# Patient Record
Sex: Female | Born: 1975 | Race: Black or African American | Hispanic: No | Marital: Single | Smoking: Current some day smoker
Health system: Southern US, Community
[De-identification: ages and names within clinical notes are randomized; demographics above are authoritative.]

## PROBLEM LIST (undated history)

## (undated) DIAGNOSIS — R51 Headache: Secondary | ICD-10-CM

## (undated) DIAGNOSIS — N6019 Diffuse cystic mastopathy of unspecified breast: Secondary | ICD-10-CM

## (undated) DIAGNOSIS — R0989 Other specified symptoms and signs involving the circulatory and respiratory systems: Secondary | ICD-10-CM

## (undated) DIAGNOSIS — N61 Mastitis without abscess: Secondary | ICD-10-CM

## (undated) DIAGNOSIS — R059 Cough, unspecified: Secondary | ICD-10-CM

## (undated) DIAGNOSIS — N611 Abscess of the breast and nipple: Secondary | ICD-10-CM

## (undated) DIAGNOSIS — R569 Unspecified convulsions: Secondary | ICD-10-CM

## (undated) DIAGNOSIS — R519 Headache, unspecified: Secondary | ICD-10-CM

## (undated) DIAGNOSIS — R05 Cough: Secondary | ICD-10-CM

## (undated) DIAGNOSIS — I10 Essential (primary) hypertension: Secondary | ICD-10-CM

## (undated) HISTORY — DX: Headache, unspecified: R51.9

## (undated) HISTORY — DX: Headache: R51

## (undated) HISTORY — PX: BREAST EXCISIONAL BIOPSY: SUR124

## (undated) HISTORY — DX: Diffuse cystic mastopathy of unspecified breast: N60.19

---
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
## 1999-10-18 HISTORY — PX: TUBAL LIGATION: SHX77

## 2000-10-17 HISTORY — PX: WRIST FRACTURE SURGERY: SHX121

## 2001-06-28 ENCOUNTER — Emergency Department (HOSPITAL_COMMUNITY): Admission: EM | Admit: 2001-06-28 | Discharge: 2001-06-28 | Payer: Self-pay

## 2003-01-18 ENCOUNTER — Emergency Department (HOSPITAL_COMMUNITY): Admission: EM | Admit: 2003-01-18 | Discharge: 2003-01-18 | Payer: Self-pay | Admitting: Emergency Medicine

## 2004-01-18 ENCOUNTER — Emergency Department (HOSPITAL_COMMUNITY): Admission: EM | Admit: 2004-01-18 | Discharge: 2004-01-18 | Payer: Self-pay | Admitting: Emergency Medicine

## 2004-07-26 ENCOUNTER — Emergency Department (HOSPITAL_COMMUNITY): Admission: EM | Admit: 2004-07-26 | Discharge: 2004-07-26 | Payer: Self-pay | Admitting: Emergency Medicine

## 2004-07-27 ENCOUNTER — Encounter: Admission: RE | Admit: 2004-07-27 | Discharge: 2004-07-27 | Payer: Self-pay

## 2005-02-12 ENCOUNTER — Emergency Department (HOSPITAL_COMMUNITY): Admission: EM | Admit: 2005-02-12 | Discharge: 2005-02-12 | Payer: Self-pay | Admitting: Emergency Medicine

## 2007-08-01 ENCOUNTER — Emergency Department (HOSPITAL_COMMUNITY): Admission: EM | Admit: 2007-08-01 | Discharge: 2007-08-01 | Payer: Self-pay | Admitting: Emergency Medicine

## 2008-05-01 ENCOUNTER — Ambulatory Visit: Payer: Self-pay | Admitting: Internal Medicine

## 2008-05-01 ENCOUNTER — Encounter (INDEPENDENT_AMBULATORY_CARE_PROVIDER_SITE_OTHER): Payer: Self-pay | Admitting: Family Medicine

## 2008-05-01 LAB — CONVERTED CEMR LAB
ALT: 11 units/L (ref 0–35)
AST: 13 units/L (ref 0–37)
Albumin: 4.3 g/dL (ref 3.5–5.2)
Alkaline Phosphatase: 78 units/L (ref 39–117)
BUN: 13 mg/dL (ref 6–23)
Basophils Absolute: 0 10*3/uL (ref 0.0–0.1)
Basophils Relative: 1 % (ref 0–1)
CO2: 22 meq/L (ref 19–32)
Calcium: 9 mg/dL (ref 8.4–10.5)
Chloride: 105 meq/L (ref 96–112)
Creatinine, Ser: 0.93 mg/dL (ref 0.40–1.20)
Eosinophils Absolute: 0 10*3/uL (ref 0.0–0.7)
Eosinophils Relative: 1 % (ref 0–5)
Glucose, Bld: 90 mg/dL (ref 70–99)
HCT: 39.9 % (ref 36.0–46.0)
Hemoglobin: 13.4 g/dL (ref 12.0–15.0)
Lymphocytes Relative: 42 % (ref 12–46)
Lymphs Abs: 1.5 10*3/uL (ref 0.7–4.0)
MCHC: 33.6 g/dL (ref 30.0–36.0)
MCV: 84.2 fL (ref 78.0–100.0)
Monocytes Absolute: 0.2 10*3/uL (ref 0.1–1.0)
Monocytes Relative: 6 % (ref 3–12)
Neutro Abs: 1.8 10*3/uL (ref 1.7–7.7)
Neutrophils Relative %: 51 % (ref 43–77)
Platelets: 274 10*3/uL (ref 150–400)
Potassium: 4 meq/L (ref 3.5–5.3)
RBC: 4.74 M/uL (ref 3.87–5.11)
RDW: 15 % (ref 11.5–15.5)
Sed Rate: 7 mm/hr (ref 0–22)
Sodium: 138 meq/L (ref 135–145)
TSH: 0.894 microintl units/mL (ref 0.350–4.50)
Total Bilirubin: 0.3 mg/dL (ref 0.3–1.2)
Total Protein: 7.6 g/dL (ref 6.0–8.3)
WBC: 3.6 10*3/uL — ABNORMAL LOW (ref 4.0–10.5)

## 2008-05-08 ENCOUNTER — Ambulatory Visit (HOSPITAL_COMMUNITY): Admission: RE | Admit: 2008-05-08 | Discharge: 2008-05-08 | Payer: Self-pay | Admitting: Family Medicine

## 2008-07-04 ENCOUNTER — Encounter (INDEPENDENT_AMBULATORY_CARE_PROVIDER_SITE_OTHER): Payer: Self-pay | Admitting: Family Medicine

## 2008-07-04 ENCOUNTER — Ambulatory Visit: Payer: Self-pay | Admitting: Family Medicine

## 2008-07-04 LAB — CONVERTED CEMR LAB
Chlamydia, DNA Probe: POSITIVE — AB
GC Probe Amp, Genital: NEGATIVE

## 2008-07-07 ENCOUNTER — Ambulatory Visit: Payer: Self-pay | Admitting: *Deleted

## 2008-07-15 ENCOUNTER — Ambulatory Visit (HOSPITAL_COMMUNITY): Admission: RE | Admit: 2008-07-15 | Discharge: 2008-07-15 | Payer: Self-pay | Admitting: Family Medicine

## 2008-10-03 ENCOUNTER — Ambulatory Visit: Payer: Self-pay | Admitting: Obstetrics & Gynecology

## 2008-12-09 ENCOUNTER — Ambulatory Visit: Payer: Self-pay | Admitting: Family Medicine

## 2009-01-14 ENCOUNTER — Ambulatory Visit: Payer: Self-pay | Admitting: Family Medicine

## 2009-04-22 ENCOUNTER — Ambulatory Visit: Payer: Self-pay | Admitting: Internal Medicine

## 2009-04-27 ENCOUNTER — Ambulatory Visit: Payer: Self-pay | Admitting: Family Medicine

## 2009-07-31 ENCOUNTER — Encounter (INDEPENDENT_AMBULATORY_CARE_PROVIDER_SITE_OTHER): Payer: Self-pay | Admitting: Family Medicine

## 2009-07-31 ENCOUNTER — Ambulatory Visit: Payer: Self-pay | Admitting: Family Medicine

## 2009-07-31 LAB — CONVERTED CEMR LAB
Chlamydia, DNA Probe: NEGATIVE
GC Probe Amp, Genital: NEGATIVE

## 2009-08-07 ENCOUNTER — Ambulatory Visit: Payer: Self-pay | Admitting: Family Medicine

## 2009-08-07 ENCOUNTER — Telehealth (INDEPENDENT_AMBULATORY_CARE_PROVIDER_SITE_OTHER): Payer: Self-pay | Admitting: *Deleted

## 2009-12-22 ENCOUNTER — Ambulatory Visit: Payer: Self-pay | Admitting: Family Medicine

## 2009-12-22 LAB — CONVERTED CEMR LAB
ALT: 10 units/L (ref 0–35)
AST: 13 units/L (ref 0–37)
Albumin: 4.2 g/dL (ref 3.5–5.2)
Alkaline Phosphatase: 87 units/L (ref 39–117)
Anti Nuclear Antibody(ANA): NEGATIVE
BUN: 14 mg/dL (ref 6–23)
Basophils Absolute: 0 10*3/uL (ref 0.0–0.1)
Basophils Relative: 1 % (ref 0–1)
CO2: 24 meq/L (ref 19–32)
Calcium: 9.4 mg/dL (ref 8.4–10.5)
Chloride: 103 meq/L (ref 96–112)
Creatinine, Ser: 0.91 mg/dL (ref 0.40–1.20)
Eosinophils Absolute: 0.1 10*3/uL (ref 0.0–0.7)
Eosinophils Relative: 2 % (ref 0–5)
Free T4: 0.99 ng/dL (ref 0.80–1.80)
Glucose, Bld: 95 mg/dL (ref 70–99)
HCT: 39.9 % (ref 36.0–46.0)
Hemoglobin: 12.9 g/dL (ref 12.0–15.0)
Lymphocytes Relative: 41 % (ref 12–46)
Lymphs Abs: 2.1 10*3/uL (ref 0.7–4.0)
MCHC: 32.3 g/dL (ref 30.0–36.0)
MCV: 86.7 fL (ref 78.0–100.0)
Monocytes Absolute: 0.3 10*3/uL (ref 0.1–1.0)
Monocytes Relative: 6 % (ref 3–12)
Neutro Abs: 2.6 10*3/uL (ref 1.7–7.7)
Neutrophils Relative %: 51 % (ref 43–77)
Platelets: 345 10*3/uL (ref 150–400)
Potassium: 4.3 meq/L (ref 3.5–5.3)
RBC: 4.6 M/uL (ref 3.87–5.11)
RDW: 13 % (ref 11.5–15.5)
Rheumatoid fact SerPl-aCnc: <20 intl units/mL (ref 0–20)
Sodium: 138 meq/L (ref 135–145)
TSH: 1.189 microintl units/mL (ref 0.350–4.500)
Total Bilirubin: 0.2 mg/dL — ABNORMAL LOW (ref 0.3–1.2)
Total Protein: 7.3 g/dL (ref 6.0–8.3)
Vit D, 25-Hydroxy: <10 ng/mL — ABNORMAL LOW (ref 30–89)
WBC: 5.2 10*3/uL (ref 4.0–10.5)

## 2010-02-05 ENCOUNTER — Emergency Department (HOSPITAL_COMMUNITY): Admission: EM | Admit: 2010-02-05 | Discharge: 2010-02-05 | Payer: Self-pay | Admitting: Family Medicine

## 2010-02-23 ENCOUNTER — Ambulatory Visit: Payer: Self-pay | Admitting: Internal Medicine

## 2010-04-02 ENCOUNTER — Ambulatory Visit: Payer: Self-pay | Admitting: Family Medicine

## 2010-05-04 ENCOUNTER — Ambulatory Visit: Payer: Self-pay | Admitting: Family Medicine

## 2010-05-04 LAB — CONVERTED CEMR LAB: Vit D, 25-Hydroxy: 22 ng/mL — ABNORMAL LOW (ref 30–89)

## 2010-05-26 ENCOUNTER — Emergency Department (HOSPITAL_COMMUNITY): Admission: EM | Admit: 2010-05-26 | Discharge: 2010-05-26 | Payer: Self-pay | Admitting: Family Medicine

## 2010-06-17 ENCOUNTER — Ambulatory Visit: Payer: Self-pay | Admitting: Family Medicine

## 2010-06-18 ENCOUNTER — Ambulatory Visit (HOSPITAL_COMMUNITY): Admission: RE | Admit: 2010-06-18 | Discharge: 2010-06-18 | Payer: Self-pay | Admitting: Family Medicine

## 2010-06-18 ENCOUNTER — Encounter: Admission: RE | Admit: 2010-06-18 | Discharge: 2010-06-18 | Payer: Self-pay | Admitting: Family Medicine

## 2011-01-17 ENCOUNTER — Other Ambulatory Visit: Payer: Self-pay | Admitting: Family Medicine

## 2011-02-13 ENCOUNTER — Emergency Department (HOSPITAL_COMMUNITY)
Admission: EM | Admit: 2011-02-13 | Discharge: 2011-02-13 | Disposition: A | Discharge status: Home / Self Care | Payer: Self-pay | Attending: Emergency Medicine | Admitting: Emergency Medicine

## 2011-02-13 DIAGNOSIS — H6690 Otitis media, unspecified, unspecified ear: Secondary | ICD-10-CM | POA: Insufficient documentation

## 2011-02-13 DIAGNOSIS — H9209 Otalgia, unspecified ear: Secondary | ICD-10-CM | POA: Insufficient documentation

## 2011-03-01 NOTE — Group Therapy Note (Signed)
Melissa Valencia, RENFRO NO.:  [id number]   MEDICAL RECORD NO.:  [id number]          PATIENT TYPE:  WOC   LOCATION:  [facility]                   FACILITY:  [facility]   PHYSICIAN:  Johnella Moloney, MD        DATE OF BIRTH:  1976/09/02   DATE OF SERVICE:                                  CLINIC NOTE   REASON FOR VISIT:  This patient was referred by Dr. Audria Nine at  [facility] for abnormal findings on her ultrasound.   The patient is a 35 year old G4, P3-0-1-3.  She was in her usual state  of health and went to establish care at [facility] and have her  complete physical exam.  On examination, she was noted to have  tenderness to palpation in the right lower quadrant of her abdomen.  Dr.  Audria Nine then sent her for ultrasound, which showed thickening of the  endometrial stripe measuring 2.4 cm, thought to be endometrial  hyperplasia versus a polyp.  Next, probable hemorrhagic cyst within the  right ovary measuring 3.2 x 1.9 cm and Radiology recommended a followup  pelvic ultrasound in 8-12 weeks if clinically warranted.  However, the  patient is not having any symptoms.  She reports she is not really  having any abdominal pain.  The only time she had pain was upon  palpation of her abdomen.  She is not having abnormal bleeding.  She has  regular 28-day cycles and does not report excessive bleeding or bleeding  in between cycles.   PAST MEDICAL HISTORY:  Significant for pregnancy-induced hypertension.  However, this was resolved after she delivered her children.  Although,  of note, today her diastolic pressure is a little bit high at 87.   FAMILY MEDICAL HISTORY:  Significant for hypertension.   SOCIAL HISTORY:  The patient lives with 2 of her 3 children.  She smokes  half-a-pack a day for the past 12 years.  She uses occasional alcohol.  No recreational drugs.   PAST SURGERIES:  Bilateral tubal ligation.   Discussed this case with Dr. Silas Flood who states that all the  findings on  ultrasound are normal for a premenopausal moment of Ms. Lightner's age,  so no further evaluation is needed at this time.  She would need to come  back,  however, if she started to have symptoms such as excessive bleeding,  bleeding in between cycles, or pelvic pain.  She was told she could  follow up p.r.n. at the [facility].     ______________________________  Argentina Donovan, MD    ______________________________  Johnella Moloney, MD    PR/MEDQ  D:  10/03/2008  T:  10/04/2008  Job:  [id number]   cc:   Dow Adolph  Fax: [phone]

## 2011-03-04 NOTE — Consult Note (Signed)
   NAME:  Melissa Valencia, Melissa Valencia                        ACCOUNT NO.:  [id number]   MEDICAL RECORD NO.:  [id number]                   PATIENT TYPE:  EMS   LOCATION:  ED                                   FACILITY:  [facility]   PHYSICIAN:  Abigail Miyamoto, M.D.              DATE OF BIRTH:  Mar 09, 1976   DATE OF CONSULTATION:  01/18/2003  DATE OF DISCHARGE:                                   CONSULTATION   CHIEF COMPLAINT:  Perianal abscess.   HISTORY OF PRESENT ILLNESS:  This is a pleasant 35 year old female who  presents with a perianal abscess.  She reports it developed approximately  well over a week ago, has been slowly getting worse.  She has noticed no  drainage from the area.  She has had no prior history of perianal abscesses.   FAMILY HISTORY:  Negative for diabetes.   PAST MEDICAL HISTORY:  She is otherwise totally healthy.   PAST SURGICAL HISTORY:  1. Tubal ligation.  2. Operation on her right wrist.   ALLERGIES:  No known drug allergies.   MEDICATIONS:  She takes no medications.   SOCIAL HISTORY:  She does smoke cigarettes and drink alcohol occasionally.  She is employed at a nursing home.   REVIEW OF SYMPTOMS:  Otherwise unremarkable.   PHYSICAL EXAMINATION:  GENERAL:  This is a well-developed, well-nourished  female in no acute distress.  VITAL SIGNS:  She is afebrile, vital signs are  stable.  RECTAL:  Examination of the perianal area with the patient in  supine position, she has a fluctuant abscess on her left buttocks near the  gluteal cleft.   ASSESSMENT AND PLAN:  This is a patient with a perianal abscess.  At this  point, the area was prepped with Betadine, anesthetized with 1% lidocaine  with epinephrine.  A small incision was made with a #15 blade and a large  purulent cavity was identified.  The purulence was expressed from the  cavity, and the cavity was then washed with normal saline.  Dry gauze was  then placed into the cavity.  At this point, wound care  instructions were  given.  I did place her on Keflex 500 mg t.i.d. x5 days, and gave her a  prescription for Vicodin for pain.  She will follow up with our office on a  p.r.n. basis.                                                 Abigail Miyamoto, M.D.   DB/MEDQ  D:  01/18/2003  T:  01/19/2003  Job:  [id number]

## 2011-04-25 ENCOUNTER — Ambulatory Visit (INDEPENDENT_AMBULATORY_CARE_PROVIDER_SITE_OTHER): Payer: Self-pay | Admitting: Surgery

## 2011-04-25 ENCOUNTER — Ambulatory Visit (INDEPENDENT_AMBULATORY_CARE_PROVIDER_SITE_OTHER): Payer: Self-pay

## 2011-05-05 ENCOUNTER — Ambulatory Visit (INDEPENDENT_AMBULATORY_CARE_PROVIDER_SITE_OTHER): Payer: Self-pay | Admitting: General Surgery

## 2011-05-06 ENCOUNTER — Encounter (INDEPENDENT_AMBULATORY_CARE_PROVIDER_SITE_OTHER): Payer: Self-pay | Admitting: General Surgery

## 2011-05-09 ENCOUNTER — Encounter (INDEPENDENT_AMBULATORY_CARE_PROVIDER_SITE_OTHER): Payer: Self-pay | Admitting: General Surgery

## 2011-05-09 ENCOUNTER — Ambulatory Visit (INDEPENDENT_AMBULATORY_CARE_PROVIDER_SITE_OTHER): Payer: PRIVATE HEALTH INSURANCE | Admitting: General Surgery

## 2011-05-09 VITALS — BP 142/98 | HR 64 | Temp 96.6°F | Ht 66.0 in | Wt 226.2 lb

## 2011-05-09 DIAGNOSIS — N6019 Diffuse cystic mastopathy of unspecified breast: Secondary | ICD-10-CM

## 2011-05-09 MED ORDER — PREDNISONE (PAK) 10 MG PO TABS: ORAL_TABLET | ORAL | Status: DC

## 2011-05-09 NOTE — Progress Notes (Signed)
Melissa Valencia is a 35 y.o. female.    Chief Complaint  Patient presents with  . Other    est pt- lump in rt br    HPI HPI The patient has been having a lump near her R nipple over a year.  It swells during her period and usually turns red and opens and drains.  She has had a mammogram in September of 2011 which was negative for concern for malignancy.  Her nipples are asymmetric and the right one is painful.  She has not been on antibiotics for the last 3 months.  She denies fevers/chills.   Past Medical History  Diagnosis Date  . Breast lump   . Breast lump     rt  . Generalized headaches     Past Surgical History  Procedure Date  . Induced abortion 1996  . Tubal ligation 2001  . Broke wrist 2002    Family History  Problem Relation Age of Onset  . Hypertension Mother   . Hypertension Father   . Hypertension Sister     Social History History  Substance Use Topics  . Smoking status: Former Games developer  . Smokeless tobacco: Not on file  . Alcohol Use: 0.6 oz/week    1 Glasses of wine per week    No Known Allergies  Current Outpatient Prescriptions  Medication Sig Dispense Refill  . predniSONE (DELTASONE) 10 MG tablet pack Take 6 tabs po qday times 2 days, then 5 tabs po qday times 2 days, then 4 tabs po qday times 2 days, then 3 tabs po qday times 2 days, then 2 tabs po qday times 2 days, then 1 tab po times 4 days, then stop.  44 tablet  0    Review of Systems Review of Systems  Constitutional: Negative.   HENT: Negative.   Eyes: Negative.   Respiratory: Negative.   Cardiovascular: Negative.   Gastrointestinal: Negative.   Genitourinary: Negative.   Musculoskeletal: Negative.   Skin: Negative.   Neurological: Negative.   Endo/Heme/Allergies: Negative.   Psychiatric/Behavioral: Negative.     Physical Exam Physical Exam  Constitutional: She is oriented to person, place, and time. She appears well-developed and well-nourished.  HENT:  Head:  Normocephalic and atraumatic.  Mouth/Throat: No oropharyngeal exudate.  Eyes: Conjunctivae are normal. Pupils are equal, round, and reactive to light. No scleral icterus.  Neck: Normal range of motion. Neck supple. No tracheal deviation present. No thyromegaly present.  Cardiovascular: Normal rate and regular rhythm.   Respiratory: Effort normal and breath sounds normal. No respiratory distress. She exhibits no tenderness. Right breast exhibits inverted nipple, mass, skin change and tenderness. Right breast exhibits no nipple discharge. Left breast exhibits no inverted nipple, no mass, no nipple discharge, no skin change and no tenderness. Breasts are asymmetrical.         The R nipple has a nodule at 2-3 oclock that is 1 cm. It is tender.  There is a scar from a prior I&D  GI: Soft. She exhibits no distension and no mass. There is no tenderness.  Musculoskeletal: Normal range of motion.  Lymphadenopathy:    She has no cervical adenopathy.  Neurological: She is alert and oriented to person, place, and time.  Psychiatric: She has a normal mood and affect. Her behavior is normal. Judgment and thought content normal.     Blood pressure 142/98, pulse 64, temperature 96.6 F (35.9 C), height 5\' 6"  (1.676 m), weight 226 lb 3.2 oz (102.604 kg).  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 If no improvement, resect chronic infectious tract.  Jordie Schreur 05/09/2011, 3:23 PM

## 2011-05-10 ENCOUNTER — Emergency Department (HOSPITAL_COMMUNITY)
Admission: EM | Admit: 2011-05-10 | Discharge: 2011-05-10 | Disposition: A | Discharge status: Home / Self Care | Payer: Self-pay | Attending: Emergency Medicine | Admitting: Emergency Medicine

## 2011-05-10 ENCOUNTER — Inpatient Hospital Stay (INDEPENDENT_AMBULATORY_CARE_PROVIDER_SITE_OTHER)
Admission: RE | Admit: 2011-05-10 | Discharge: 2011-05-10 | Disposition: A | Discharge status: Home / Self Care | Payer: Self-pay | Source: Ambulatory Visit | Attending: Emergency Medicine | Admitting: Emergency Medicine

## 2011-05-10 ENCOUNTER — Emergency Department (HOSPITAL_COMMUNITY): Payer: Self-pay

## 2011-05-10 DIAGNOSIS — R110 Nausea: Secondary | ICD-10-CM | POA: Insufficient documentation

## 2011-05-10 DIAGNOSIS — R42 Dizziness and giddiness: Secondary | ICD-10-CM | POA: Insufficient documentation

## 2011-05-10 DIAGNOSIS — H53149 Visual discomfort, unspecified: Secondary | ICD-10-CM | POA: Insufficient documentation

## 2011-05-10 DIAGNOSIS — R51 Headache: Secondary | ICD-10-CM

## 2011-06-16 ENCOUNTER — Ambulatory Visit (INDEPENDENT_AMBULATORY_CARE_PROVIDER_SITE_OTHER): Payer: PRIVATE HEALTH INSURANCE | Admitting: General Surgery

## 2011-06-16 ENCOUNTER — Encounter (INDEPENDENT_AMBULATORY_CARE_PROVIDER_SITE_OTHER): Payer: Self-pay

## 2011-06-16 ENCOUNTER — Encounter (INDEPENDENT_AMBULATORY_CARE_PROVIDER_SITE_OTHER): Payer: Self-pay | Admitting: General Surgery

## 2011-06-16 DIAGNOSIS — N6019 Diffuse cystic mastopathy of unspecified breast: Secondary | ICD-10-CM

## 2011-06-16 MED ORDER — PREDNISONE (PAK) 10 MG PO TABS: ORAL_TABLET | ORAL | Status: DC

## 2011-06-16 NOTE — Progress Notes (Signed)
HISTORY: Pt states that the steroid course made the knot and the drainage go completely away until her period started.  She had been off the steroids for around a week when her period started.  At that point, she developed a knot on the medial R nipple that opened and drained with neosporin and a band-aid on it.  It has improved since it drained, and is still better than it has been over the last two years.  She denies fever/chills.     PERTINENT REVIEW OF SYSTEMS: Otherwise negative other than the HPI.     EXAM: Head: Normocephalic and atraumatic.  Eyes:  Conjunctivae are normal. Pupils are equal, round, and reactive to light. No scleral icterus.  Neck:  Normal range of motion. Neck supple. No tracheal deviation present. No thyromegaly present.  Resp: No respiratory distress, normal effort. Neurological: Alert and oriented to person, place, and time. Coordination normal.  Skin: Skin is warm and dry. No rash noted. No diaphoretic. No erythema. No pallor.  Psychiatric: Normal mood and affect. Normal behavior. Judgment and thought content normal.  Breast exam:  R breast with decreased nipple retraction.  No mass palpable and hard knot at medial nipple not present.  There is a small spot where it is apparent drainage has been coming from, but this is absent at this time.     ASSESSMENT AND PLAN:   Mastitis chronic Significant improvement w prednisone. Brief recurrence around menstrual cycle.  Try another course of steroids through menstrual cycle. Follow up in 8 weeks.         Maudry Diego, MD Surgical Oncology, General & Endocrine Surgery [facility]  Dow Adolph, MD Dow Adolph, MD

## 2011-06-16 NOTE — Assessment & Plan Note (Signed)
Significant improvement w prednisone. Brief recurrence around menstrual cycle.  Try another course of steroids through menstrual cycle. Follow up in 8 weeks.

## 2011-06-17 ENCOUNTER — Encounter (INDEPENDENT_AMBULATORY_CARE_PROVIDER_SITE_OTHER): Payer: PRIVATE HEALTH INSURANCE | Admitting: General Surgery

## 2011-07-27 LAB — URINE MICROSCOPIC-ADD ON

## 2011-07-27 LAB — URINALYSIS, ROUTINE W REFLEX MICROSCOPIC
Bilirubin Urine: NEGATIVE
Glucose, UA: NEGATIVE
Ketones, ur: NEGATIVE
Nitrite: NEGATIVE
Protein, ur: 100 — AB
Specific Gravity, Urine: 1.024
Urobilinogen, UA: 1
pH: 6.5

## 2011-07-27 LAB — PREGNANCY, URINE: Preg Test, Ur: NEGATIVE

## 2011-08-01 ENCOUNTER — Encounter (INDEPENDENT_AMBULATORY_CARE_PROVIDER_SITE_OTHER): Payer: PRIVATE HEALTH INSURANCE | Admitting: General Surgery

## 2011-08-05 ENCOUNTER — Encounter (INDEPENDENT_AMBULATORY_CARE_PROVIDER_SITE_OTHER): Payer: Self-pay

## 2011-08-05 ENCOUNTER — Telehealth (INDEPENDENT_AMBULATORY_CARE_PROVIDER_SITE_OTHER): Payer: Self-pay

## 2011-08-05 ENCOUNTER — Other Ambulatory Visit (INDEPENDENT_AMBULATORY_CARE_PROVIDER_SITE_OTHER): Payer: Self-pay | Admitting: General Surgery

## 2011-08-05 DIAGNOSIS — N6019 Diffuse cystic mastopathy of unspecified breast: Secondary | ICD-10-CM

## 2011-08-05 MED ORDER — PREDNISONE (PAK) 10 MG PO TABS: ORAL_TABLET | ORAL | Status: DC

## 2011-08-05 NOTE — Telephone Encounter (Signed)
Called in refill for Prednisone 10mg  tablets #68 w/ no refills to [facility].  Pt is aware.

## 2011-08-16 ENCOUNTER — Encounter (INDEPENDENT_AMBULATORY_CARE_PROVIDER_SITE_OTHER): Payer: Self-pay | Admitting: General Surgery

## 2011-08-16 ENCOUNTER — Ambulatory Visit (INDEPENDENT_AMBULATORY_CARE_PROVIDER_SITE_OTHER): Payer: PRIVATE HEALTH INSURANCE | Admitting: General Surgery

## 2011-08-16 VITALS — BP 132/90 | HR 64 | Temp 97.6°F | Resp 14 | Ht 67.0 in | Wt 228.8 lb

## 2011-08-16 DIAGNOSIS — N6019 Diffuse cystic mastopathy of unspecified breast: Secondary | ICD-10-CM

## 2011-08-16 MED ORDER — DOXYCYCLINE HYCLATE 100 MG PO TABS: 100.0000 mg | ORAL_TABLET | Freq: Two times a day (BID) | ORAL | Status: AC

## 2011-08-16 NOTE — Assessment & Plan Note (Signed)
Had development of small abscess and spontaneous rupture. Give doxycycline 100 mg po BID times 3 weeks.   Follow up in 4 weeks.   May at some point need I&D or resection

## 2011-08-16 NOTE — Progress Notes (Signed)
HISTORY: Patient is a 35 year old female with a history of intermittent infections in her right breast. She recently developed pain. She has a trial of prednisone and this helped the pain initially it better. However she then developed swelling and spontaneous rupture of this area. She denies any fevers or chills.  She continues to have some purulent drainage from an area on the medial aspect of the nipple.   PERTINENT REVIEW OF SYSTEMS: Otherwise negative.     EXAM: Head: Normocephalic and atraumatic.  Eyes:  Conjunctivae are normal. Pupils are equal, round, and reactive to light. No scleral icterus.  Neck:  Normal range of motion. Neck supple. No tracheal deviation present. No thyromegaly present.  Resp: No respiratory distress, normal effort. Abd:  Abdomen is soft, non distended and non tender. No masses are palpable.  There is no rebound and no guarding.  Neurological: Alert and oriented to person, place, and time. Coordination normal.  Skin: Skin is warm and dry. No rash noted. No diaphoretic. No erythema. No pallor.  Psychiatric: Normal mood and affect. Normal behavior. Judgment and thought content normal.    ASSESSMENT AND PLAN:   Chronic mastitis Had development of small abscess and spontaneous rupture. Give doxycycline 100 mg po BID times 3 weeks.   Follow up in 4 weeks.   May at some point need I&D or resection        Maudry Diego, MD Surgical Oncology, General & Endocrine Surgery [facility]  Dow Adolph, MD Dow Adolph, MD

## 2011-08-16 NOTE — Patient Instructions (Signed)
Take antibiotic. Follow up with me in 4 weeks.

## 2011-09-05 ENCOUNTER — Encounter (INDEPENDENT_AMBULATORY_CARE_PROVIDER_SITE_OTHER): Payer: Self-pay | Admitting: General Surgery

## 2011-09-16 ENCOUNTER — Ambulatory Visit (INDEPENDENT_AMBULATORY_CARE_PROVIDER_SITE_OTHER): Payer: PRIVATE HEALTH INSURANCE | Admitting: General Surgery

## 2011-09-16 ENCOUNTER — Other Ambulatory Visit (INDEPENDENT_AMBULATORY_CARE_PROVIDER_SITE_OTHER): Payer: Self-pay | Admitting: General Surgery

## 2011-09-16 ENCOUNTER — Encounter (INDEPENDENT_AMBULATORY_CARE_PROVIDER_SITE_OTHER): Payer: Self-pay

## 2011-09-16 ENCOUNTER — Encounter (INDEPENDENT_AMBULATORY_CARE_PROVIDER_SITE_OTHER): Payer: Self-pay | Admitting: General Surgery

## 2011-09-16 VITALS — BP 114/88 | HR 80 | Temp 97.4°F | Resp 16 | Ht 67.0 in | Wt 230.8 lb

## 2011-09-16 DIAGNOSIS — N611 Abscess of the breast and nipple: Secondary | ICD-10-CM | POA: Insufficient documentation

## 2011-09-16 DIAGNOSIS — N61 Mastitis without abscess: Secondary | ICD-10-CM

## 2011-09-16 MED ORDER — HYDROCODONE-ACETAMINOPHEN 5-500 MG PO TABS: ORAL_TABLET | ORAL | Status: DC

## 2011-09-16 NOTE — Progress Notes (Signed)
Addended by: Maryan Puls on: 09/16/2011 01:11 PM   Modules accepted: Orders

## 2011-09-16 NOTE — Progress Notes (Signed)
Addended by: Milas Hock on: 09/16/2011 10:42 AM   Modules accepted: Orders

## 2011-09-16 NOTE — Patient Instructions (Signed)
IF YOU ARE TAKING ASPIRIN, COUMADIN/WARFARIN, PLAVIX, OR OTHER BLOOD THINNER, PLEASE LET us KNOW IMMEDIATELY.  WE WILL NEED TO DISCUSS WITH THE PRESCRIBING PROVIDER IF THESE ARE SAFE TO STOP. IF THESE ARE NOT STOPPED AT THE APPROPRIATE TIME, THIS WILL RESULT IN A DELAY FOR YOUR SURGERY.  DO NOT TAKE THESE MEDICATIONS OR IBUPROFEN/NAPROXEN WITHIN A WEEK BEFORE SURGERY.   The main risks of surgery are bleeding, infection, damage to other structures, and seroma (accumulation of fluid) under the incision site(s).  You are at high risk for recurrent infections.    These complications may lead to additional procedures such as drainage of seroma/infection.   Most women do accumulate fluid in the breast cavity where the specimen was removed. We do not always have to drain this fluid.  If your breast is very tense, painful, or red, then we may need to numb the skin and use a needle to aspirate the fluid.  We do provide patients with a Breast Binder.  The purpose of this is to avoid the use of tape on the sensitive tissue of the breast and to provide some compression to minimize the risk of seroma.  If the binder is uncomfortable, you may find that a tank top with a built-in shelf bra or a loose sports bra works better for you.  I recommend wearing this around the clock for the first 1-2 weeks except in the shower.    You may remove your dressings and may shower 48 hours after surgery IF YOU DO NOT HAVE A DRAIN.    Many patients have some constipation in the week after surgery due to the narcotics and anesthesia.  You may need over the counter stool softeners or laxatives if you experience difficulty having bowel movements.    If the following occur, call our office at [phone]: If you have a fever over 101 or pain that is severe despite narcotics. If you have redness or drainage at the wound. If you develop persistent nausea or vomiting.  I will follow you back up in 1-4 weeks.    Please submit  any paperwork about time off work/insurance forms to the front desk.

## 2011-09-16 NOTE — Progress Notes (Signed)
Chief Complaint  Patient presents with  . Follow-up    reck rt br inflammation    HISTORY: Patient is a 35-year-old female who I have been seeing for several months for her chronic right breast abscess. She has an abscess that comes up every Monday when she is on her period. It usually spontaneously drains, but by the time she sees me in her breast is soft and nontender.  She presents today with a firm painful right breast. She has not had any fevers or chills. She has had redness of the breast.  Past Medical History  Diagnosis Date  . Breast lump   . Breast lump     rt  . Generalized headaches   . Breast inflammation     Past Surgical History  Procedure Date  . Induced abortion 1996  . Tubal ligation 2001  . Broke wrist 2002    Current Outpatient Prescriptions  Medication Sig Dispense Refill  . lisinopril (PRINIVIL,ZESTRIL) 10 MG tablet Take 10 mg by mouth daily.           No Known Allergies   Family History  Problem Relation Age of Onset  . Hypertension Mother   . Hypertension Father   . Hypertension Sister      History   Social History  . Marital Status: Single    Spouse Name: N/A    Number of Children: N/A  . Years of Education: N/A   Social History Main Topics  . Smoking status: Former Smoker  . Smokeless tobacco: Never Used  . Alcohol Use: 0.6 oz/week    1 Glasses of wine per week  . Drug Use: No  . Sexually Active: None    REVIEW OF SYSTEMS - PERTINENT POSITIVES ONLY: 12 point review of systems negative other than HPI and PMH.    EXAM: Filed Vitals:   09/16/11 0938  BP: 114/88  Pulse: 80  Temp: 97.4 F (36.3 C)  Resp: 16    Gen:  No acute distress.  Well nourished and well groomed.   Neurological: Alert and oriented to person, place, and time. Coordination normal.  Head: Normocephalic and atraumatic.  Eyes: Conjunctivae are normal. Pupils are equal, round, and reactive to light. No scleral icterus.  Cardiovascular: Normal rate, intact  distal pulses.   Respiratory: Effort normal.  No respiratory distress.  Breast:  R breast has fluctuant area at 3 oclock just at the border of the nipple.  This is anesthetized, and incised with a #11 blade.  Copious amounts of pus are expressed.  This is cultured.   GI: Soft. Bowel sounds are normal. The abdomen is soft and nontender.  There is no rebound and no guarding.  Musculoskeletal: Normal range of motion. Extremities are nontender.  Lymphadenopathy: No cervical, preauricular, postauricular or axillary adenopathy is present Skin: Skin is warm and dry. No rash noted. No diaphoresis. No erythema. No pallor. No clubbing, cyanosis, or edema.   Psychiatric: Normal mood and affect. Behavior is normal. Judgment and thought content normal.    ASSESSMENT AND PLAN: Abscess of right breast I&D performed in standard fashion after administration of local anesthetic.   Will plan excision in OR of chronic area of infection.    The surgical procedure was described to the patient.  I discussed the incision type and location and whether we would need radiology involved on the day of surgery with a wire marker and/or sentinel node.      The risks and benefits of the procedure were   described to the patient and he/she wishes to proceed.    We discussed the risks bleeding, infection, damage to other structures, need for further procedures/surgeries.  We discussed the risk of seroma.  The patient was advised if the area in the breast in cancer, we may need to go back to surgery for additional tissue to obtain negative margins or for a lymph node biopsy. The patient was advised that these are the most common complications, but that others can occur as well.  They were advised against taking aspirin or other anti-inflammatory agents/blood thinners the week before surgery.         Shataya Winkles L Khelani Kops MD Surgical Oncology, General and Endocrine Surgery [facility]      Visit  Diagnoses: 1. Abscess of right breast     Primary Care Physician: MCPHERSON,BARBARA, MD    

## 2011-09-16 NOTE — Assessment & Plan Note (Addendum)
I&D performed in standard fashion after administration of local anesthetic.   Will plan excision in OR of chronic area of infection.    The surgical procedure was described to the patient.  I discussed the incision type and location and whether we would need radiology involved on the day of surgery with a wire marker and/or sentinel node.      The risks and benefits of the procedure were described to the patient and he/she wishes to proceed.    We discussed the risks bleeding, infection, damage to other structures, need for further procedures/surgeries.  We discussed the risk of seroma.  The patient was advised if the area in the breast in cancer, we may need to go back to surgery for additional tissue to obtain negative margins or for a lymph node biopsy. The patient was advised that these are the most common complications, but that others can occur as well.  They were advised against taking aspirin or other anti-inflammatory agents/blood thinners the week before surgery.

## 2011-09-19 LAB — WOUND CULTURE
Gram Stain: NONE SEEN
Organism ID, Bacteria: NO GROWTH

## 2011-09-26 ENCOUNTER — Encounter (INDEPENDENT_AMBULATORY_CARE_PROVIDER_SITE_OTHER): Payer: Self-pay | Admitting: General Surgery

## 2011-09-29 ENCOUNTER — Encounter (HOSPITAL_BASED_OUTPATIENT_CLINIC_OR_DEPARTMENT_OTHER): Payer: Self-pay | Admitting: *Deleted

## 2011-09-29 NOTE — Pre-Procedure Instructions (Signed)
To come for BMET, EKG

## 2011-10-03 ENCOUNTER — Encounter (HOSPITAL_BASED_OUTPATIENT_CLINIC_OR_DEPARTMENT_OTHER)
Admission: RE | Admit: 2011-10-03 | Discharge: 2011-10-03 | Disposition: A | Discharge status: Home / Self Care | Payer: Self-pay | Source: Ambulatory Visit | Attending: General Surgery | Admitting: General Surgery

## 2011-10-03 LAB — BASIC METABOLIC PANEL
BUN: 14 mg/dL (ref 6–23)
CO2: 26 mEq/L (ref 19–32)
Calcium: 9.1 mg/dL (ref 8.4–10.5)
Creatinine, Ser: 1.02 mg/dL (ref 0.50–1.10)
Glucose, Bld: 91 mg/dL (ref 70–99)

## 2011-10-05 ENCOUNTER — Encounter (HOSPITAL_BASED_OUTPATIENT_CLINIC_OR_DEPARTMENT_OTHER)
Admission: RE | Disposition: A | Discharge status: Home / Self Care | Payer: Self-pay | Source: Ambulatory Visit | Attending: General Surgery

## 2011-10-05 ENCOUNTER — Ambulatory Visit (HOSPITAL_BASED_OUTPATIENT_CLINIC_OR_DEPARTMENT_OTHER)
Admission: RE | Admit: 2011-10-05 | Discharge: 2011-10-05 | Disposition: A | Discharge status: Home / Self Care | Payer: Self-pay | Source: Ambulatory Visit | Attending: General Surgery | Admitting: General Surgery

## 2011-10-05 ENCOUNTER — Encounter (HOSPITAL_BASED_OUTPATIENT_CLINIC_OR_DEPARTMENT_OTHER): Payer: Self-pay

## 2011-10-05 ENCOUNTER — Encounter (HOSPITAL_BASED_OUTPATIENT_CLINIC_OR_DEPARTMENT_OTHER): Payer: Self-pay | Admitting: Anesthesiology

## 2011-10-05 ENCOUNTER — Encounter (HOSPITAL_BASED_OUTPATIENT_CLINIC_OR_DEPARTMENT_OTHER): Payer: Self-pay | Admitting: General Surgery

## 2011-10-05 ENCOUNTER — Ambulatory Visit (HOSPITAL_BASED_OUTPATIENT_CLINIC_OR_DEPARTMENT_OTHER): Payer: Self-pay | Admitting: Anesthesiology

## 2011-10-05 DIAGNOSIS — N61 Mastitis without abscess: Secondary | ICD-10-CM

## 2011-10-05 HISTORY — DX: Abscess of the breast and nipple: N61.1

## 2011-10-05 HISTORY — DX: Unspecified convulsions: R56.9

## 2011-10-05 HISTORY — DX: Cough: R05

## 2011-10-05 HISTORY — DX: Essential (primary) hypertension: I10

## 2011-10-05 HISTORY — PX: BREAST CYST EXCISION: SHX579

## 2011-10-05 HISTORY — DX: Other specified symptoms and signs involving the circulatory and respiratory systems: R09.89

## 2011-10-05 HISTORY — DX: Cough, unspecified: R05.9

## 2011-10-05 HISTORY — DX: Mastitis without abscess: N61.0

## 2011-10-05 SURGERY — EXCISION, CYST, BREAST
Anesthesia: General | Site: Breast | Laterality: Right | Wound class: Clean

## 2011-10-05 MED ORDER — DEXAMETHASONE SODIUM PHOSPHATE 4 MG/ML IJ SOLN
INTRAMUSCULAR | Status: DC | PRN
  Administered 2011-10-05: 10 mg via INTRAVENOUS

## 2011-10-05 MED ORDER — PROPOFOL 10 MG/ML IV EMUL
INTRAVENOUS | Status: DC | PRN
  Administered 2011-10-05: 250 mg via INTRAVENOUS

## 2011-10-05 MED ORDER — LIDOCAINE-EPINEPHRINE (PF) 1 %-1:200000 IJ SOLN
INTRAMUSCULAR | Status: DC | PRN
  Administered 2011-10-05: 10 mL

## 2011-10-05 MED ORDER — FENTANYL CITRATE 0.05 MG/ML IJ SOLN: 50.0000 ug | INTRAMUSCULAR | Status: DC | PRN

## 2011-10-05 MED ORDER — MIDAZOLAM HCL 2 MG/2ML IJ SOLN: 0.5000 mg | INTRAMUSCULAR | Status: DC | PRN

## 2011-10-05 MED ORDER — ONDANSETRON HCL 4 MG/2ML IJ SOLN
INTRAMUSCULAR | Status: DC | PRN
  Administered 2011-10-05: 4 mg via INTRAVENOUS

## 2011-10-05 MED ORDER — CEFAZOLIN SODIUM-DEXTROSE 2-3 GM-% IV SOLR
2.0000 g | INTRAVENOUS | Status: AC
  Administered 2011-10-05: 2 g via INTRAVENOUS

## 2011-10-05 MED ORDER — METOCLOPRAMIDE HCL 5 MG/ML IJ SOLN
10.0000 mg | Freq: Once | INTRAMUSCULAR | Status: AC | PRN
  Administered 2011-10-05 (×2): 10 mg via INTRAVENOUS

## 2011-10-05 MED ORDER — BUPIVACAINE HCL (PF) 0.25 % IJ SOLN
INTRAMUSCULAR | Status: DC | PRN
  Administered 2011-10-05: 10 mL

## 2011-10-05 MED ORDER — OXYCODONE-ACETAMINOPHEN 5-325 MG PO TABS: 1.0000 | ORAL_TABLET | ORAL | Status: DC | PRN

## 2011-10-05 MED ORDER — LIDOCAINE HCL (CARDIAC) 20 MG/ML IV SOLN
INTRAVENOUS | Status: DC | PRN
  Administered 2011-10-05: 75 mg via INTRAVENOUS

## 2011-10-05 MED ORDER — DROPERIDOL 2.5 MG/ML IJ SOLN
INTRAMUSCULAR | Status: DC | PRN
  Administered 2011-10-05: 0.625 mg via INTRAVENOUS

## 2011-10-05 MED ORDER — LACTATED RINGERS IV SOLN: INTRAVENOUS | Status: DC

## 2011-10-05 MED ORDER — MIDAZOLAM HCL 5 MG/5ML IJ SOLN
INTRAMUSCULAR | Status: DC | PRN
  Administered 2011-10-05: 2 mg via INTRAVENOUS

## 2011-10-05 MED ORDER — MORPHINE SULFATE 2 MG/ML IJ SOLN: 0.0500 mg/kg | INTRAMUSCULAR | Status: DC | PRN

## 2011-10-05 MED ORDER — FENTANYL CITRATE 0.05 MG/ML IJ SOLN
INTRAMUSCULAR | Status: DC | PRN
  Administered 2011-10-05: 100 ug via INTRAVENOUS
  Administered 2011-10-05 (×2): 50 ug via INTRAVENOUS

## 2011-10-05 MED ORDER — FENTANYL CITRATE 0.05 MG/ML IJ SOLN: 25.0000 ug | INTRAMUSCULAR | Status: DC | PRN

## 2011-10-05 MED ORDER — LACTATED RINGERS IV SOLN
INTRAVENOUS | Status: DC | PRN
  Administered 2011-10-05 (×2): via INTRAVENOUS

## 2011-10-05 SURGICAL SUPPLY — 44 items
BINDER BREAST XLRG (GAUZE/BANDAGES/DRESSINGS) ×2 IMPLANT
BLADE HEX COATED 2.75 (ELECTRODE) ×2 IMPLANT
BLADE SURG 15 STRL LF DISP TIS (BLADE) ×1 IMPLANT
BLADE SURG 15 STRL SS (BLADE) ×1
CANISTER SUCTION 1200CC (MISCELLANEOUS) ×2 IMPLANT
CHLORAPREP W/TINT 26ML (MISCELLANEOUS) ×2 IMPLANT
CLIP TI WIDE RED SMALL 6 (CLIP) IMPLANT
CLOTH BEACON ORANGE TIMEOUT ST (SAFETY) ×2 IMPLANT
COVER MAYO STAND STRL (DRAPES) ×2 IMPLANT
COVER TABLE BACK 60X90 (DRAPES) ×2 IMPLANT
DECANTER SPIKE VIAL GLASS SM (MISCELLANEOUS) ×2 IMPLANT
DEVICE DUBIN W/COMP PLATE 8390 (MISCELLANEOUS) IMPLANT
DRAPE PED LAPAROTOMY (DRAPES) ×2 IMPLANT
DRAPE UTILITY XL STRL (DRAPES) ×2 IMPLANT
ELECT REM PT RETURN 9FT ADLT (ELECTROSURGICAL) ×2
ELECTRODE REM PT RTRN 9FT ADLT (ELECTROSURGICAL) ×1 IMPLANT
GLOVE BIO SURGEON STRL SZ 6 (GLOVE) ×2 IMPLANT
GLOVE BIOGEL PI IND STRL 6.5 (GLOVE) ×1 IMPLANT
GLOVE BIOGEL PI INDICATOR 6.5 (GLOVE) ×1
GOWN PREVENTION PLUS XLARGE (GOWN DISPOSABLE) ×2 IMPLANT
GOWN PREVENTION PLUS XXLARGE (GOWN DISPOSABLE) ×2 IMPLANT
KIT MARKER MARGIN INK (KITS) IMPLANT
NEEDLE HYPO 25X1 1.5 SAFETY (NEEDLE) ×2 IMPLANT
NS IRRIG 1000ML POUR BTL (IV SOLUTION) ×2 IMPLANT
PACK BASIN DAY SURGERY FS (CUSTOM PROCEDURE TRAY) ×2 IMPLANT
PENCIL BUTTON HOLSTER BLD 10FT (ELECTRODE) ×2 IMPLANT
SLEEVE SCD COMPRESS KNEE MED (MISCELLANEOUS) ×2 IMPLANT
SPONGE GAUZE 4X4 16PLY UNSTER (WOUND CARE) ×4 IMPLANT
SPONGE LAP 18X18 X RAY DECT (DISPOSABLE) ×2 IMPLANT
STAPLER VISISTAT 35W (STAPLE) IMPLANT
STRIP CLOSURE SKIN 1/2X4 (GAUZE/BANDAGES/DRESSINGS) ×2 IMPLANT
SUT MON AB 4-0 PC3 18 (SUTURE) ×2 IMPLANT
SUT SILK 2 0 SH (SUTURE) IMPLANT
SUT VIC AB 3-0 54X BRD REEL (SUTURE) IMPLANT
SUT VIC AB 3-0 BRD 54 (SUTURE)
SUT VIC AB 3-0 SH 27 (SUTURE) ×1
SUT VIC AB 3-0 SH 27X BRD (SUTURE) ×1 IMPLANT
SYR BULB 3OZ (MISCELLANEOUS) ×2 IMPLANT
SYR CONTROL 10ML LL (SYRINGE) ×2 IMPLANT
TOWEL OR 17X24 6PK STRL BLUE (TOWEL DISPOSABLE) ×2 IMPLANT
TOWEL OR NON WOVEN STRL DISP B (DISPOSABLE) ×2 IMPLANT
TUBE CONNECTING 20X1/4 (TUBING) ×2 IMPLANT
WATER STERILE IRR 1000ML POUR (IV SOLUTION) IMPLANT
YANKAUER SUCT BULB TIP NO VENT (SUCTIONS) ×2 IMPLANT

## 2011-10-05 NOTE — Transfer of Care (Signed)
Immediate Anesthesia Transfer of Care Note  Patient: Melissa Valencia  Procedure(s) Performed:  CYST EXCISION BREAST - Chronic right breast abscess. Excision of chronic right breast infection  Patient Location: PACU  Anesthesia Type: General  Level of Consciousness: awake, alert  and oriented  Airway & Oxygen Therapy: Patient Spontanous Breathing  Post-op Assessment: Report given to PACU RN and Post -op Vital signs reviewed and stable  Post vital signs: Reviewed and stable  Complications: No apparent anesthesia complications

## 2011-10-05 NOTE — Anesthesia Preprocedure Evaluation (Signed)
Anesthesia Evaluation  Patient identified by MRN, date of birth, ID band Patient awake    Reviewed: Allergy & Precautions, H&P , NPO status , Patient's Chart, lab work & pertinent test results, reviewed documented beta blocker date and time   Airway Mallampati: II TM Distance: >3 FB Neck ROM: full    Dental   Pulmonary neg pulmonary ROS,          Cardiovascular hypertension,     Neuro/Psych Negative Neurological ROS  Negative Psych ROS   GI/Hepatic negative GI ROS, Neg liver ROS,   Endo/Other  Negative Endocrine ROS  Renal/GU negative Renal ROS  Genitourinary negative   Musculoskeletal   Abdominal   Peds  Hematology negative hematology ROS (+)   Anesthesia Other Findings See surgeon's H&P   Reproductive/Obstetrics negative OB ROS                           Anesthesia Physical Anesthesia Plan  ASA: II  Anesthesia Plan: General   Post-op Pain Management:    Induction: Intravenous  Airway Management Planned: LMA  Additional Equipment:   Intra-op Plan:   Post-operative Plan: Extubation in OR  Informed Consent: I have reviewed the patients History and Physical, chart, labs and discussed the procedure including the risks, benefits and alternatives for the proposed anesthesia with the patient or authorized representative who has indicated his/her understanding and acceptance.     Plan Discussed with: CRNA and Surgeon  Anesthesia Plan Comments:         Anesthesia Quick Evaluation  

## 2011-10-05 NOTE — Anesthesia Procedure Notes (Signed)
Procedure Name: LMA Insertion Date/Time: 10/05/2011 8:50 AM Performed by: Zenia Resides D Pre-anesthesia Checklist: Patient identified, Emergency Drugs available, Suction available and Patient being monitored Patient Re-evaluated:Patient Re-evaluated prior to inductionOxygen Delivery Method: Circle System Utilized Preoxygenation: Pre-oxygenation with 100% oxygen Intubation Type: IV induction Ventilation: Mask ventilation without difficulty LMA: LMA with gastric port inserted LMA Size: 4.0 Number of attempts: 1 Placement Confirmation: positive ETCO2 and breath sounds checked- equal and bilateral Tube secured with: Tape Dental Injury: Teeth and Oropharynx as per pre-operative assessment

## 2011-10-05 NOTE — H&P (View-Only) (Signed)
Chief Complaint  Patient presents with  . Follow-up    reck rt br inflammation    HISTORY: Patient is a 35 year old female who I have been seeing for several months for her chronic right breast abscess. She has an abscess that comes up every Monday when she is on her period. It usually spontaneously drains, but by the time she sees me in her breast is soft and nontender.  She presents today with a firm painful right breast. She has not had any fevers or chills. She has had redness of the breast.  Past Medical History  Diagnosis Date  . Breast lump   . Breast lump     rt  . Generalized headaches   . Breast inflammation     Past Surgical History  Procedure Date  . Induced abortion 1996  . Tubal ligation 2001  . Broke wrist 2002    Current Outpatient Prescriptions  Medication Sig Dispense Refill  . lisinopril (PRINIVIL,ZESTRIL) 10 MG tablet Take 10 mg by mouth daily.           No Known Allergies   Family History  Problem Relation Age of Onset  . Hypertension Mother   . Hypertension Father   . Hypertension Sister      History   Social History  . Marital Status: Single    Spouse Name: N/A    Number of Children: N/A  . Years of Education: N/A   Social History Main Topics  . Smoking status: Former Games developer  . Smokeless tobacco: Never Used  . Alcohol Use: 0.6 oz/week    1 Glasses of wine per week  . Drug Use: No  . Sexually Active: None    REVIEW OF SYSTEMS - PERTINENT POSITIVES ONLY: 12 point review of systems negative other than HPI and PMH.    EXAMCeasar Mons Vitals:   09/16/11 0938  BP: 114/88  Pulse: 80  Temp: 97.4 F (36.3 C)  Resp: 16    Gen:  No acute distress.  Well nourished and well groomed.   Neurological: Alert and oriented to person, place, and time. Coordination normal.  Head: Normocephalic and atraumatic.  Eyes: Conjunctivae are normal. Pupils are equal, round, and reactive to light. No scleral icterus.  Cardiovascular: Normal rate, intact  distal pulses.   Respiratory: Effort normal.  No respiratory distress.  Breast:  R breast has fluctuant area at 3 oclock just at the border of the nipple.  This is anesthetized, and incised with a #11 blade.  Copious amounts of pus are expressed.  This is cultured.   GI: Soft. Bowel sounds are normal. The abdomen is soft and nontender.  There is no rebound and no guarding.  Musculoskeletal: Normal range of motion. Extremities are nontender.  Lymphadenopathy: No cervical, preauricular, postauricular or axillary adenopathy is present Skin: Skin is warm and dry. No rash noted. No diaphoresis. No erythema. No pallor. No clubbing, cyanosis, or edema.   Psychiatric: Normal mood and affect. Behavior is normal. Judgment and thought content normal.    ASSESSMENT AND PLAN: Abscess of right breast I&D performed in standard fashion after administration of local anesthetic.   Will plan excision in OR of chronic area of infection.    The surgical procedure was described to the patient.  I discussed the incision type and location and whether we would need radiology involved on the day of surgery with a wire marker and/or sentinel node.      The risks and benefits of the procedure were  described to the patient and he/she wishes to proceed.    We discussed the risks bleeding, infection, damage to other structures, need for further procedures/surgeries.  We discussed the risk of seroma.  The patient was advised if the area in the breast in cancer, we may need to go back to surgery for additional tissue to obtain negative margins or for a lymph node biopsy. The patient was advised that these are the most common complications, but that others can occur as well.  They were advised against taking aspirin or other anti-inflammatory agents/blood thinners the week before surgery.         Maudry Diego MD Surgical Oncology, General and Endocrine Surgery [facility]      Visit  Diagnoses: 1. Abscess of right breast     Primary Care Physician: Dow Adolph, MD

## 2011-10-05 NOTE — Anesthesia Postprocedure Evaluation (Signed)
  Anesthesia Post Note  Patient: Melissa Valencia  Procedure(s) Performed:  CYST EXCISION BREAST - Chronic right breast abscess. Excision of chronic right breast infection  Anesthesia type: General  Patient location: PACU  Post pain: Pain level controlled  Post assessment: Patient's Cardiovascular Status Stable  Last Vitals:  Filed Vitals:   10/05/11 1028  BP:   Pulse: 86  Temp:   Resp: 15    Post vital signs: Reviewed and stable  Level of consciousness: alert  Complications: No apparent anesthesia complications

## 2011-10-05 NOTE — Interval H&P Note (Signed)
History and Physical Interval Note:  10/05/2011 8:35 AM  Melissa Valencia  has presented today for surgery, with the diagnosis of Excision of chronic Right breast infection Chronic right breast abscess.  The various methods of treatment have been discussed with the patient and family. After consideration of risks, benefits and other options for treatment, the patient has consented to  Procedure(s): CYST EXCISION BREAST as a surgical intervention .  The patients' history has been reviewed, patient examined, no change in status, stable for surgery.  I have reviewed the patients' chart and labs.  Questions were answered to the patient's satisfaction.  We discussed the risk of recurrent infection.     Anne Boltz

## 2011-10-05 NOTE — Op Note (Signed)
R excisional Breast Biopsy  Indications: This patient presents with history of right chronic breast abscess that has occurred monthly with her period.    Pre-operative Diagnosis: R breast chronic abscess  Post-operative Diagnosis: Same  Surgeon: Almond Lint   Anesthesia: General endotracheal anesthesia  ASA Class: 2  Procedure Details  The patient was seen in the Holding Room. The risks, benefits, complications, treatment options, and expected outcomes were discussed with the patient. The possibilities of bleeding, infection, the need for additional procedures, possible recurrent abscess, breast asymmetry and creating a complication requiring transfusion or operation were discussed with the patient. The patient concurred with the proposed plan, giving informed consent.  The site of surgery properly noted/marked. The patient was taken to Operating Room # 1, identified as Tillman Sers and the procedure verified as Breast Lumpectomy and Sentinal Node Biopsy. A Time Out was held and the above information confirmed.  After induction of anesthesia, the right arm, breast, and chest were prepped and draped in standard fashion.  A time out was performed according to the surgical safety checklist.    An ellipse of the areola was removed with the area that has been chronically draining the breast abscess.  Skin hooks were used to elevate both sides of the incision in order to create flaps above the chronically indurated tissue.  The curved mayo scissors were used to dissect out the firm area.    Hemostasis was achieved with cautery.  The wound was irrigated and closed with a 3-0 vicryl interrupted deep dermal sutures and 4-0 Vicryl subcuticular closure in layers.    Sterile dressings were applied. At the end of the operation, all sponge, instrument, and needle counts were correct.  A breast binder was placed.  Findings: grossly clear surgical margins  Estimated Blood Loss:  Minimal           Specimens: R breast chronic abscess         Complications:  None; patient tolerated the procedure well.         Disposition: PACU - hemodynamically stable.         Condition: stable

## 2011-10-07 ENCOUNTER — Encounter (HOSPITAL_BASED_OUTPATIENT_CLINIC_OR_DEPARTMENT_OTHER): Payer: Self-pay | Admitting: General Surgery

## 2011-10-19 ENCOUNTER — Encounter (INDEPENDENT_AMBULATORY_CARE_PROVIDER_SITE_OTHER): Payer: Self-pay | Admitting: General Surgery

## 2011-10-19 ENCOUNTER — Ambulatory Visit (INDEPENDENT_AMBULATORY_CARE_PROVIDER_SITE_OTHER): Payer: PRIVATE HEALTH INSURANCE | Admitting: General Surgery

## 2011-10-19 ENCOUNTER — Encounter (INDEPENDENT_AMBULATORY_CARE_PROVIDER_SITE_OTHER): Payer: Self-pay

## 2011-10-19 VITALS — BP 118/88 | HR 80 | Temp 97.0°F | Ht 66.0 in | Wt 229.2 lb

## 2011-10-19 DIAGNOSIS — N6019 Diffuse cystic mastopathy of unspecified breast: Secondary | ICD-10-CM

## 2011-10-19 MED ORDER — AMOXICILLIN-POT CLAVULANATE 875-125 MG PO TABS: 1.0000 | ORAL_TABLET | Freq: Two times a day (BID) | ORAL | Status: AC

## 2011-10-19 NOTE — Patient Instructions (Signed)
Take antibiotic.

## 2011-10-19 NOTE — Assessment & Plan Note (Addendum)
Pt with seroma and mild skin inflammation.    Will give augmentin for 1 week  Follow up in 2-3 weeks.  Pt with high likelihood to recur with infection in various locations of the breast.    May require trial of steroids.

## 2011-10-19 NOTE — Progress Notes (Signed)
HISTORY: Pt 2 weeks s/p resection of chronic R breast abscess.  She has some pain and swelling of the breast.  She had some clear bloody drainage of the incision several days ago.  She denies fever/chills.  She is not taking narcotics.    EXAM: Head: Normocephalic and atraumatic.  Eyes:  Conjunctivae are normal. Pupils are equal, round, and reactive to light. No scleral icterus.  Resp: No respiratory distress, normal effort. R breast with some hematoma/seroma present.  Pt with some warmth of the skin overlying, but no erythema.   Neurological: Alert and oriented to person, place, and time. Coordination normal.  Skin: Skin is warm and dry. No rash noted. No diaphoretic. No erythema. No pallor.  Psychiatric: Normal mood and affect. Normal behavior. Judgment and thought content normal.   Pathology reviewed and demonstrates: Chronic abscess with granulomatous inflammation.    ASSESSMENT AND PLAN:   Chronic mastitis Pt with seroma and mild skin inflammation.    Will give augmentin for 1 week  Follow up in 2-3 weeks.  Pt with high likelihood to recur with infection in various locations of the breast.    May require trial of steroids.       Maudry Diego, MD Surgical Oncology, General & Endocrine Surgery [facility]  Dow Adolph, MD, MD Dow Adolph, MD

## 2011-10-21 ENCOUNTER — Telehealth (INDEPENDENT_AMBULATORY_CARE_PROVIDER_SITE_OTHER): Payer: Self-pay

## 2011-10-21 NOTE — Telephone Encounter (Signed)
Pt called for written Rx for Augmentin.  Too expensive at the pharmacy.  She can get it for $4.00 at [facility]  Will p/u.

## 2011-11-08 ENCOUNTER — Encounter (INDEPENDENT_AMBULATORY_CARE_PROVIDER_SITE_OTHER): Payer: Self-pay | Admitting: General Surgery

## 2011-11-08 ENCOUNTER — Ambulatory Visit (INDEPENDENT_AMBULATORY_CARE_PROVIDER_SITE_OTHER): Payer: PRIVATE HEALTH INSURANCE | Admitting: General Surgery

## 2011-11-08 VITALS — BP 138/88 | HR 72 | Temp 97.6°F | Resp 16 | Ht 67.0 in | Wt 229.0 lb

## 2011-11-08 DIAGNOSIS — N61 Mastitis without abscess: Secondary | ICD-10-CM

## 2011-11-08 DIAGNOSIS — N611 Abscess of the breast and nipple: Secondary | ICD-10-CM

## 2011-11-08 MED ORDER — AMOXICILLIN-POT CLAVULANATE 875-125 MG PO TABS: 1.0000 | ORAL_TABLET | Freq: Two times a day (BID) | ORAL | Status: DC

## 2011-11-08 MED ORDER — AMOXICILLIN-POT CLAVULANATE 875-125 MG PO TABS: 1.0000 | ORAL_TABLET | Freq: Two times a day (BID) | ORAL | Status: AC

## 2011-11-08 NOTE — Progress Notes (Signed)
Addended by: Almond Lint on: 11/08/2011 11:51 AM   Modules accepted: Orders

## 2011-11-08 NOTE — Progress Notes (Signed)
Subjective:     Patient ID: Melissa Valencia, female   DOB: July 29, 1976, 36 y.o.   MRN: [id number]  HPI Pt did well with augmentin script that she got 3 weeks ago.  At that point post op, she had some redness and some firmness of the breast.  She still has some firmness, but most all of the soreness has gone away, and she no longer has any redness.  She is supposed to be having her period now, but is late.    Review of Systems otherwise negative.     Objective:   Physical Exam R breast with some firmness behind the nipple at the surgical site.  No warmth or erythema.  No nipple discharge is present.      Assessment/Plan:     Abscess of right breast Pt doing better. Will follow up in 4 weeks which should be around time of menses. Will give script for augmentin to fill if the breast gets red again.

## 2011-11-08 NOTE — Patient Instructions (Signed)
Take augmentin if redness or pain is recurrent.  Follow up in 4 weeks at time of menses.

## 2011-11-08 NOTE — Assessment & Plan Note (Signed)
Pt doing better. Will follow up in 4 weeks which should be around time of menses. Will give script for augmentin to fill if the breast gets red again.

## 2011-12-05 ENCOUNTER — Encounter (INDEPENDENT_AMBULATORY_CARE_PROVIDER_SITE_OTHER): Payer: PRIVATE HEALTH INSURANCE | Admitting: General Surgery

## 2012-01-30 ENCOUNTER — Ambulatory Visit (INDEPENDENT_AMBULATORY_CARE_PROVIDER_SITE_OTHER): Payer: PRIVATE HEALTH INSURANCE | Admitting: General Surgery

## 2012-01-30 ENCOUNTER — Encounter (INDEPENDENT_AMBULATORY_CARE_PROVIDER_SITE_OTHER): Payer: Self-pay | Admitting: General Surgery

## 2012-01-30 VITALS — BP 110/85 | HR 71 | Temp 97.9°F | Ht 66.5 in | Wt 224.4 lb

## 2012-01-30 DIAGNOSIS — N61 Mastitis without abscess: Secondary | ICD-10-CM

## 2012-01-30 DIAGNOSIS — N611 Abscess of the breast and nipple: Secondary | ICD-10-CM

## 2012-01-30 NOTE — Patient Instructions (Signed)
If you develop fullness like a mass or drainage, start antibiotics.  Call for follow up if these occur.  Otherwise follow up PRN.

## 2012-01-30 NOTE — Assessment & Plan Note (Signed)
There is no evidence of recurrent abscess. The patient is instructed to call our office and make an appointment if she develops fullness or redness of the right breast. Also if she develops drainage. She has a refill on antibiotics to start immediately if these things occur while she is waiting for an appointment.  I will see her on a p.r.n. basis at this time

## 2012-01-30 NOTE — Progress Notes (Signed)
HISTORY: Patient is doing better since we did a excisional biopsy of a chronic draining breast abscess. She does not have any additional drainage adjacent to her right nipple. She does still have some pain when she has her period, but she does not experience the redness and fullness that she was before. She is not having fevers or chills. She is overall doing much better. She has not noticed any others issues with her breasts.   PERTINENT REVIEW OF SYSTEMS: Otherwise negative.     EXAM: Head: Normocephalic and atraumatic.  Eyes:  Conjunctivae are normal. Pupils are equal, round, and reactive to light. No scleral icterus.  Neck:  Normal range of motion. Neck supple. No tracheal deviation present. No thyromegaly present.  Resp: No respiratory distress, normal effort. Breast:  There is nipple retraction post operatively on the right.  The right breast looks much better than before.  She has no redness or warmth of the upper outer quadrant.  Prior to surgery, she had a masslike area around 3 cm in size.  This is not present. There is no drainage or wetness on her breast.    Neurological: Alert and oriented to person, place, and time. Coordination normal.  Skin: Skin is warm and dry. No rash noted. No diaphoretic. No erythema. No pallor.  Psychiatric: Normal mood and affect. Normal behavior. Judgment and thought content normal.      ASSESSMENT AND PLAN:   Abscess of right breast There is no evidence of recurrent abscess. The patient is instructed to call our office and make an appointment if she develops fullness or redness of the right breast. Also if she develops drainage. She has a refill on antibiotics to start immediately if these things occur while she is waiting for an appointment.  I will see her on a p.r.n. basis at this time       Maudry Diego, MD Surgical Oncology, General & Endocrine Surgery [facility]  Dow Adolph, MD, MD Maurice March,  MD

## 2012-04-30 ENCOUNTER — Other Ambulatory Visit (HOSPITAL_COMMUNITY)
Admission: RE | Admit: 2012-04-30 | Discharge: 2012-04-30 | Disposition: A | Discharge status: Home / Self Care | Payer: Medicaid Other | Source: Ambulatory Visit | Attending: Family Medicine | Admitting: Family Medicine

## 2012-04-30 ENCOUNTER — Other Ambulatory Visit: Payer: Self-pay | Admitting: Family Medicine

## 2012-04-30 DIAGNOSIS — Z01419 Encounter for gynecological examination (general) (routine) without abnormal findings: Secondary | ICD-10-CM | POA: Insufficient documentation

## 2012-10-01 ENCOUNTER — Emergency Department (INDEPENDENT_AMBULATORY_CARE_PROVIDER_SITE_OTHER)
Admission: EM | Admit: 2012-10-01 | Discharge: 2012-10-01 | Disposition: A | Discharge status: Home / Self Care | Payer: Medicaid Other | Source: Home / Self Care | Attending: Emergency Medicine | Admitting: Emergency Medicine

## 2012-10-01 ENCOUNTER — Encounter (HOSPITAL_COMMUNITY): Payer: Self-pay | Admitting: *Deleted

## 2012-10-01 DIAGNOSIS — H6692 Otitis media, unspecified, left ear: Secondary | ICD-10-CM

## 2012-10-01 DIAGNOSIS — H6690 Otitis media, unspecified, unspecified ear: Secondary | ICD-10-CM

## 2012-10-01 MED ORDER — HYDROCODONE-ACETAMINOPHEN 5-325 MG PO TABS: ORAL_TABLET | ORAL | Status: DC

## 2012-10-01 MED ORDER — AMOXICILLIN 500 MG PO CAPS: 1000.0000 mg | ORAL_CAPSULE | Freq: Three times a day (TID) | ORAL | Status: DC

## 2012-10-01 NOTE — ED Provider Notes (Signed)
Chief Complaint  Patient presents with  . Otalgia    History of Present Illness:   Melissa Valencia is a 36 year old female who presents with a two-day history of left ear pain and diminished hearing. She rates the pain a 10 over 10 in intensity. There is no drainage from the ear. She does feel a little bit dizzy. She's had a slight sore throat and cough. She denies any nasal congestion, drainage, or fever. She denies any prior history of ear problems. She has high blood pressure for which she takes hydrochlorothiazide.  Review of Systems:  Other than noted above, the patient denies any of the following symptoms: Systemic:  No fevers, chills, sweats, weight loss or gain, fatigue, or tiredness. Eye:  No redness, pain, discharge, itching, blurred vision, or diplopia. ENT:  No headache, nasal congestion, sneezing, itching, epistaxis, ear pain, congestion, decreased hearing, ringing in ears, vertigo, or tinnitus.  No oral lesions, sore throat, pain on swallowing, or hoarseness. Neck:  No mass, tenderness or adenopathy. Lungs:  No coughing, wheezing, or shortness of breath. Skin:  No rash or itching.  PMFSH:  Past medical history, family history, social history, meds, and allergies were reviewed.  Physical Exam:   Vital signs:  BP 134/84  Pulse 72  Temp 98.6 F (37 C) (Oral)  SpO2 100%  LMP 09/06/2012 General:  Alert and oriented.  In no distress.  Skin warm and dry. Eye:  PERRL, full EOMs, lids and conjunctiva normal.   ENT:  The left TM was pink and dull, the canal is clear, the right TM and canal are normal.  Nasal mucosa not congested and without drainage.  Mucous membranes moist, no oral lesions, normal dentition, pharynx clear.  No cranial or facial pain to palplation. Neck:  Supple, full ROM.  No adenopathy, tenderness or mass.  Thyroid normal. Lungs:  Breath sounds clear and equal bilaterally.  No wheezes, rales or rhonchi. Heart:  Rhythm regular, without extrasystoles.  No gallops or  murmers. Skin:  Clear, warm and dry.  Assessment:  The encounter diagnosis was Left otitis media.  Plan:   1.  The following meds were prescribed:   New Prescriptions   AMOXICILLIN (AMOXIL) 500 MG CAPSULE    Take 2 capsules (1,000 mg total) by mouth 3 (three) times daily.   HYDROCODONE-ACETAMINOPHEN (NORCO/VICODIN) 5-325 MG PER TABLET    1 to 2 tabs every 4 to 6 hours as needed for pain.   2.  The patient was instructed in symptomatic care and handouts were given. 3.  The patient was told to return if becoming worse in any way, if no better in 3 or 4 days, and given some red flag symptoms that would indicate earlier return.     Reuben Likes, MD 10/01/12 [phone]

## 2012-10-01 NOTE — ED Notes (Signed)
Pt  Has  Symptoms  Of  l  Earache  As  Well   As  Cough      And  Congestion      Symptoms  X  sev  Days          Pt  Pleasant  Alert  And  Oriented  Her  Skin is  Warm    And  Dry           She  Ambulated  To  Room with a  Steady  Fluid  Gait

## 2014-08-05 ENCOUNTER — Encounter (HOSPITAL_COMMUNITY): Payer: Self-pay | Admitting: Emergency Medicine

## 2014-08-05 ENCOUNTER — Emergency Department (HOSPITAL_COMMUNITY)
Admission: EM | Admit: 2014-08-05 | Discharge: 2014-08-05 | Disposition: A | Discharge status: Home / Self Care | Payer: Medicaid Other | Attending: Emergency Medicine | Admitting: Emergency Medicine

## 2014-08-05 DIAGNOSIS — Z8742 Personal history of other diseases of the female genital tract: Secondary | ICD-10-CM | POA: Insufficient documentation

## 2014-08-05 DIAGNOSIS — I10 Essential (primary) hypertension: Secondary | ICD-10-CM | POA: Insufficient documentation

## 2014-08-05 DIAGNOSIS — Z720 Tobacco use: Secondary | ICD-10-CM | POA: Insufficient documentation

## 2014-08-05 DIAGNOSIS — X58XXXA Exposure to other specified factors, initial encounter: Secondary | ICD-10-CM | POA: Insufficient documentation

## 2014-08-05 DIAGNOSIS — S0501XA Injury of conjunctiva and corneal abrasion without foreign body, right eye, initial encounter: Secondary | ICD-10-CM | POA: Insufficient documentation

## 2014-08-05 DIAGNOSIS — Y9389 Activity, other specified: Secondary | ICD-10-CM | POA: Insufficient documentation

## 2014-08-05 DIAGNOSIS — Z79899 Other long term (current) drug therapy: Secondary | ICD-10-CM | POA: Insufficient documentation

## 2014-08-05 DIAGNOSIS — Y9289 Other specified places as the place of occurrence of the external cause: Secondary | ICD-10-CM | POA: Insufficient documentation

## 2014-08-05 MED ORDER — TETRACAINE HCL 0.5 % OP SOLN
1.0000 [drp] | Freq: Once | OPHTHALMIC | Status: AC
  Administered 2014-08-05: 1 [drp] via OPHTHALMIC
  Filled 2014-08-05: qty 2

## 2014-08-05 MED ORDER — FLUORESCEIN SODIUM 1 MG OP STRP
1.0000 | ORAL_STRIP | Freq: Once | OPHTHALMIC | Status: AC
  Administered 2014-08-05: 1 via OPHTHALMIC
  Filled 2014-08-05: qty 1

## 2014-08-05 MED ORDER — POLYMYXIN B-TRIMETHOPRIM 10000-0.1 UNIT/ML-% OP SOLN
1.0000 [drp] | OPHTHALMIC | Status: DC
  Administered 2014-08-05: 1 [drp] via OPHTHALMIC
  Filled 2014-08-05: qty 10

## 2014-08-05 MED ORDER — KETOROLAC TROMETHAMINE 0.5 % OP SOLN
1.0000 [drp] | Freq: Four times a day (QID) | OPHTHALMIC | Status: DC
  Administered 2014-08-05: 1 [drp] via OPHTHALMIC
  Filled 2014-08-05: qty 3

## 2014-08-05 NOTE — ED Provider Notes (Signed)
CSN: [id number]     Arrival date & time 08/05/14  2038 History  This chart was scribed for Sharilyn SitesLisa Cleven Jansma, PA-C, working with Richardean Canalavid H Yao, MD by Jolene Provostobert Halas, ED Scribe. This patient was seen in room WTR7/WTR7 and the patient's care was started at 10:26 PM.    Chief Complaint  Patient presents with  . Eye Pain   The history is provided by the patient. No language interpreter was used.   HPI Comments: Melissa Valencia is a 38 y.o. female who presents to the Emergency Department complaining of right eye pain intermittently over the past 3 weeks. States today the pain in her right eye was worse than before and eye has been continuously tearing. States is now red and extremely painful with photophobia noted. States she feels it is a piece of dirt stuck in her eye.  She denies any chemical or foreign body exposure to the eye. No trauma. She denies any visual disturbance.  Patient is aware glasses or contact lenses. She is not currently established with an ophthalmologist.  Past Medical History  Diagnosis Date  . Generalized headaches   . Hypertension     under control; has been on med. x 3 mos.  . Seizure 1994    due to elampsia with pregnancy  . Cough     productive of clear mucus  . Runny nose     clear drainage  . Breast infection     right; chronic  . Breast abscess     right; chronic  . Mastitis chronic    Past Surgical History  Procedure Laterality Date  . Induced abortion  1996  . Tubal ligation  2001  . Wrist fracture surgery  2002    left  . Breast cyst excision  10/05/2011    Procedure: CYST EXCISION BREAST;  Surgeon: Almond LintFaera Byerly, MD;  Location: [facility];  Service: General;  Laterality: Right;  Chronic right breast abscess. Excision of chronic right breast infection   Family History  Problem Relation Age of Onset  . Hypertension Mother   . Hypertension Father   . Hypertension Sister    History  Substance Use Topics  . Smoking status: Current Some  Day Smoker  . Smokeless tobacco: Never Used     Comment: quit smoking 2 yrs. ago  . Alcohol Use: 0.0 oz/week     Comment: occasionally   OB History   Grav Para Term Preterm Abortions TAB SAB Ect Mult Living                 Review of Systems  HENT: Negative for ear pain.   Eyes: Positive for photophobia, pain and redness.  Neurological: Negative for dizziness and headaches.  All other systems reviewed and are negative.     Allergies  Review of patient's allergies indicates no known allergies.  Home Medications   Prior to Admission medications   Medication Sig Start Date End Date Taking? Authorizing Provider  hydrochlorothiazide (HYDRODIURIL) 25 MG tablet Take 25 mg by mouth daily.   Yes Historical Provider, MD   BP 135/87  Pulse 81  Temp(Src) 98.2 F (36.8 C) (Oral)  Resp 22  SpO2 99%  LMP 08/05/2014  Physical Exam  Nursing note and vitals reviewed. Constitutional: She is oriented to person, place, and time. She appears well-developed and well-nourished. No distress.  HENT:  Head: Normocephalic and atraumatic.  Mouth/Throat: Oropharynx is clear and moist.  Eyes: EOM are normal. Pupils are equal, round, and  reactive to light. Right conjunctiva is injected. Right eye exhibits normal extraocular motion and no nystagmus.  No lid edema or erythema; right conjunctiva injected without hemorrhage; EOM intact and non-painful; fluorescein stain with evidence of conjunctival abrasion of right lateral eye; no corneal abrasion or ulcer; negative fluorescein uptake  Neck: Normal range of motion. Neck supple.  Cardiovascular: Normal rate, regular rhythm and normal heart sounds.   Pulmonary/Chest: Effort normal and breath sounds normal. No respiratory distress. She has no wheezes.  Abdominal: Soft. Bowel sounds are normal.  Musculoskeletal: Normal range of motion.  Neurological: She is alert and oriented to person, place, and time.  Skin: Skin is warm and dry. She is not  diaphoretic.  Psychiatric: She has a normal mood and affect.    ED Course  Procedures DIAGNOSTIC STUDIES: Oxygen Saturation is 99% on RA, normal by my interpretation.    COORDINATION OF CARE: 10:30 PM Discussed treatment plan with pt at bedside and pt agreed to plan.  Labs Review Labs Reviewed - No data to display  Imaging Review No results found.   EKG Interpretation None      MDM   Final diagnoses:  Conjunctival abrasion, right, initial encounter   38 year old female with right eye pain intermittently over the past 3 weeks. On exam, right conjunctiva is injected without hemorrhage.  No visual disturbance noted.  No edema or erythema to suggest orbital or preseptal cellulitis.  Fluorescein stain with evidence of conjunctival abrasion. No corneal abrasion, also, or foreign body. Negative fluorescein uptake. Patient was started on polytrim and Acular drops. She was encouraged to follow up with ophthalmology if symptoms worsen.  Discussed plan with patient, he/she acknowledged understanding and agreed with plan of care.  Return precautions given for new or worsening symptoms.  I personally performed the services described in this documentation, which was scribed in my presence. The recorded information has been reviewed and is accurate.  Garlon HatchetLisa M Kasheena Sambrano, PA-C 08/06/14 0210

## 2014-08-05 NOTE — Discharge Instructions (Signed)
Use drops as directed. Follow-up with Dr. Clarisa KindredGeiger if begin experiencing trouble with your vision or if symptoms worsen. Return to the ED for new concerns.

## 2014-08-05 NOTE — ED Notes (Signed)
Called JC in pharmacy ,  He is working on dispensing medication at this time

## 2014-08-05 NOTE — ED Notes (Signed)
Pt complains of right eye pain for three weeks, tonight her eye is red and swollen and painful, she states taht it feels like theres something in her eye but she doesn't recall anything.

## 2014-08-06 NOTE — ED Provider Notes (Signed)
Medical screening examination/treatment/procedure(s) were performed by non-physician practitioner and as supervising physician I was immediately available for consultation/collaboration.   EKG Interpretation None        Richardean Canalavid H Danalee Flath, MD 08/06/14 1459

## 2017-01-03 ENCOUNTER — Other Ambulatory Visit: Payer: Self-pay | Admitting: Internal Medicine

## 2017-01-03 DIAGNOSIS — Z1231 Encounter for screening mammogram for malignant neoplasm of breast: Secondary | ICD-10-CM

## 2017-02-13 ENCOUNTER — Ambulatory Visit
Admission: RE | Admit: 2017-02-13 | Discharge: 2017-02-13 | Disposition: A | Discharge status: Home / Self Care | Payer: Self-pay | Source: Ambulatory Visit | Attending: Internal Medicine | Admitting: Internal Medicine

## 2017-02-13 DIAGNOSIS — Z1231 Encounter for screening mammogram for malignant neoplasm of breast: Secondary | ICD-10-CM

## 2017-02-14 ENCOUNTER — Other Ambulatory Visit: Payer: Self-pay | Admitting: Internal Medicine

## 2017-02-14 DIAGNOSIS — N6452 Nipple discharge: Secondary | ICD-10-CM

## 2017-02-20 ENCOUNTER — Other Ambulatory Visit: Payer: Self-pay | Admitting: Internal Medicine

## 2017-02-22 ENCOUNTER — Ambulatory Visit
Admission: RE | Admit: 2017-02-22 | Discharge: 2017-02-22 | Disposition: A | Discharge status: Home / Self Care | Payer: BLUE CROSS/BLUE SHIELD | Source: Ambulatory Visit | Attending: Internal Medicine | Admitting: Internal Medicine

## 2017-02-22 ENCOUNTER — Other Ambulatory Visit: Payer: Self-pay | Admitting: Internal Medicine

## 2017-02-22 DIAGNOSIS — N6452 Nipple discharge: Secondary | ICD-10-CM

## 2018-07-06 IMAGING — MG 2D DIGITAL DIAGNOSTIC BILATERAL MAMMOGRAM WITH CAD AND ADJUNCT T
8 of 15 series · 8 of 35 positions shown · non-contrast
Comparison: Previous exam(s).

CLINICAL DATA: Mass with some tenderness felt by the patient in the
nipple/areolar region of the right breast for the past 4 months
before her menstrual period. This is associated with greenish
discharge arising medial and lateral to the nipple during the
patient's menstrual periods for the past 4 months with subsequent
resolution of the mass and tenderness. Status post excision of an
abscess in this region of the breast 7 years ago.

EXAM:
2D DIGITAL DIAGNOSTIC BILATERAL MAMMOGRAM WITH CAD AND ADJUNCT TOMO
ULTRASOUND RIGHT BREAST

[R MLO]
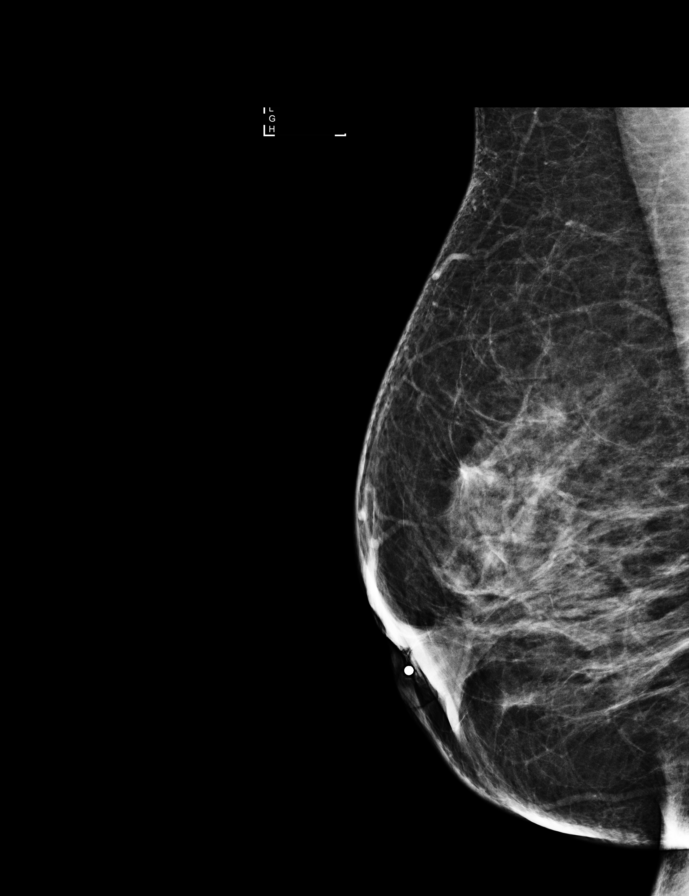

[L CC]
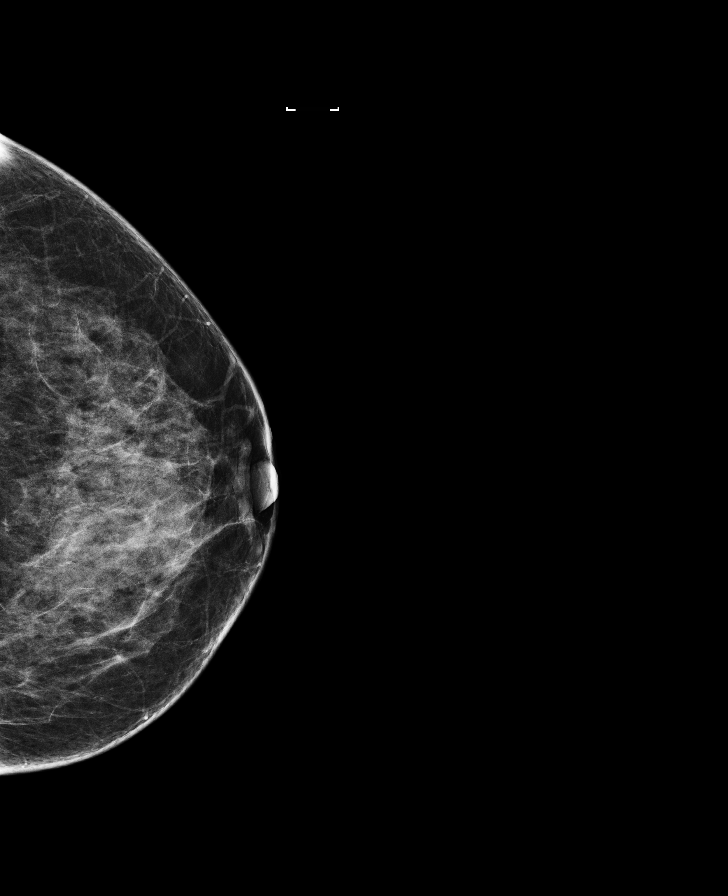

[L MLO synth-2D]
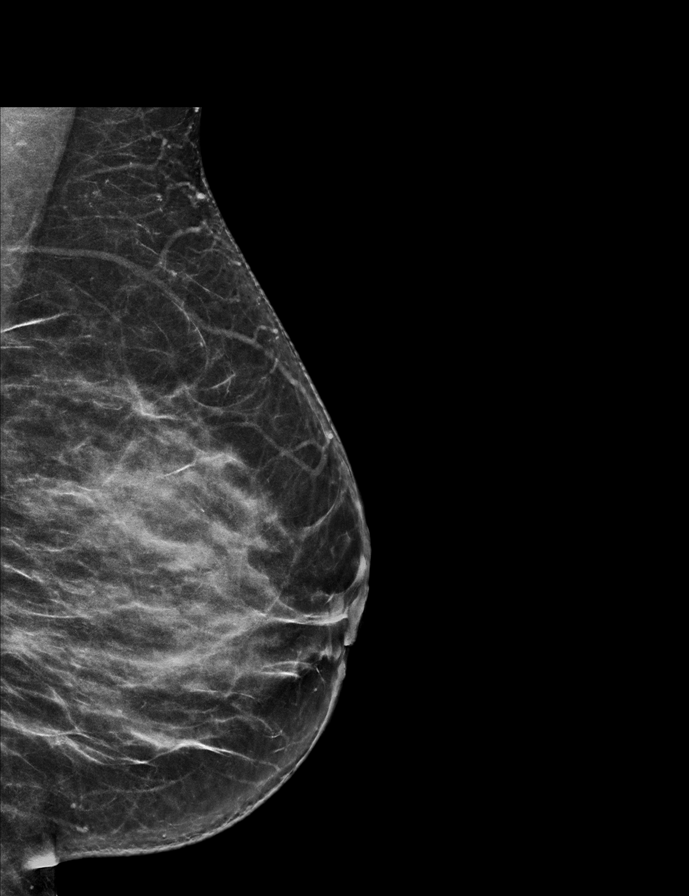

[R TAN synth-2D]
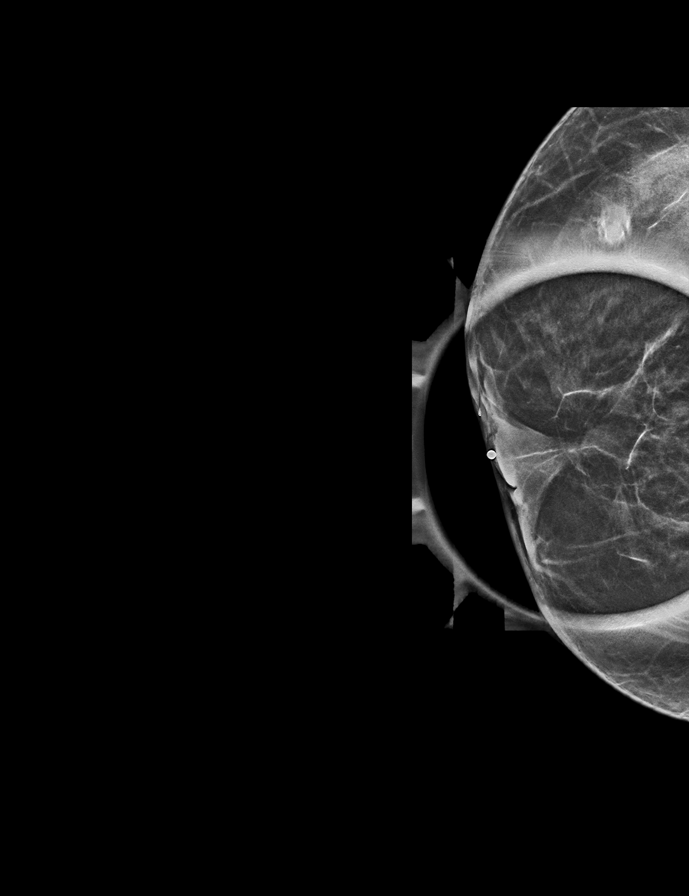

[L CC synth-2D]
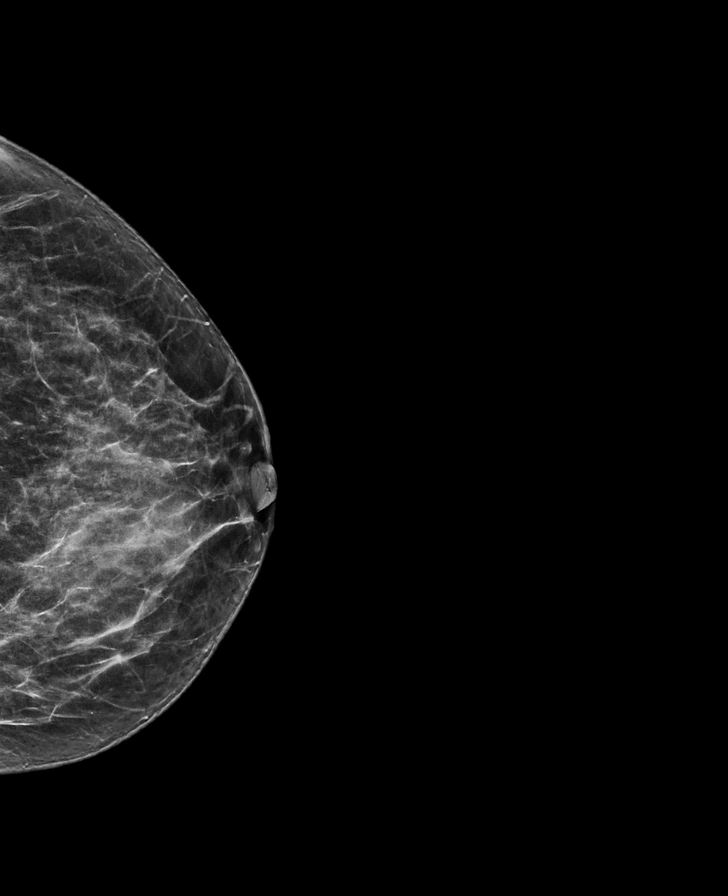

[R MLO synth-2D]
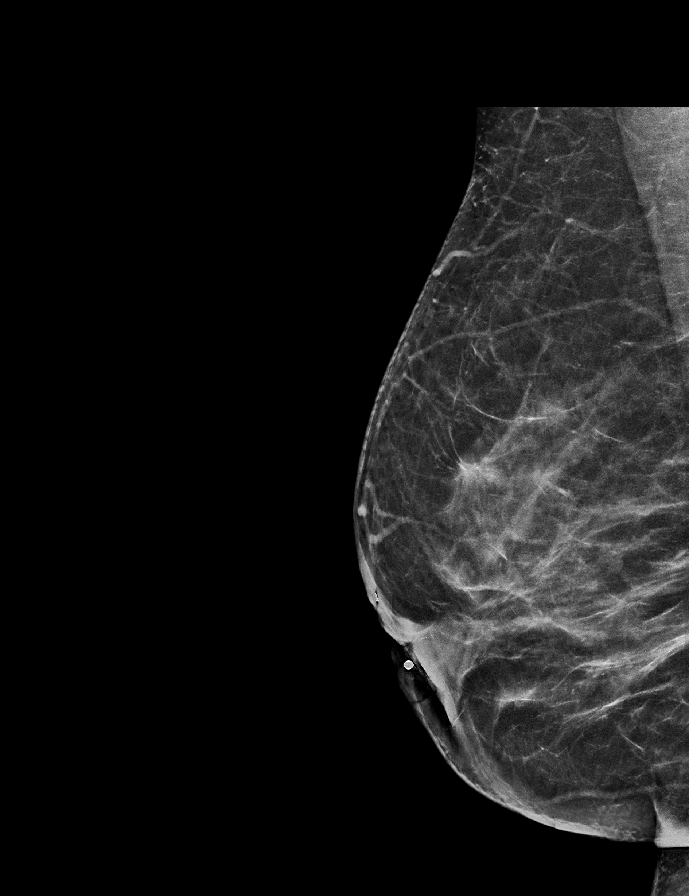

[R CC synth-2D]
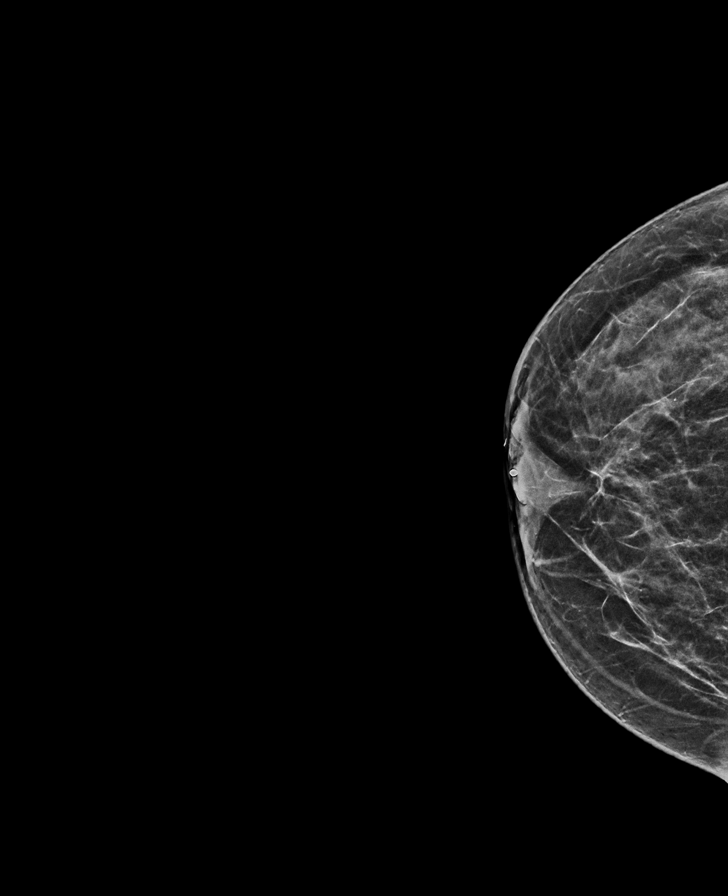

[R CC]
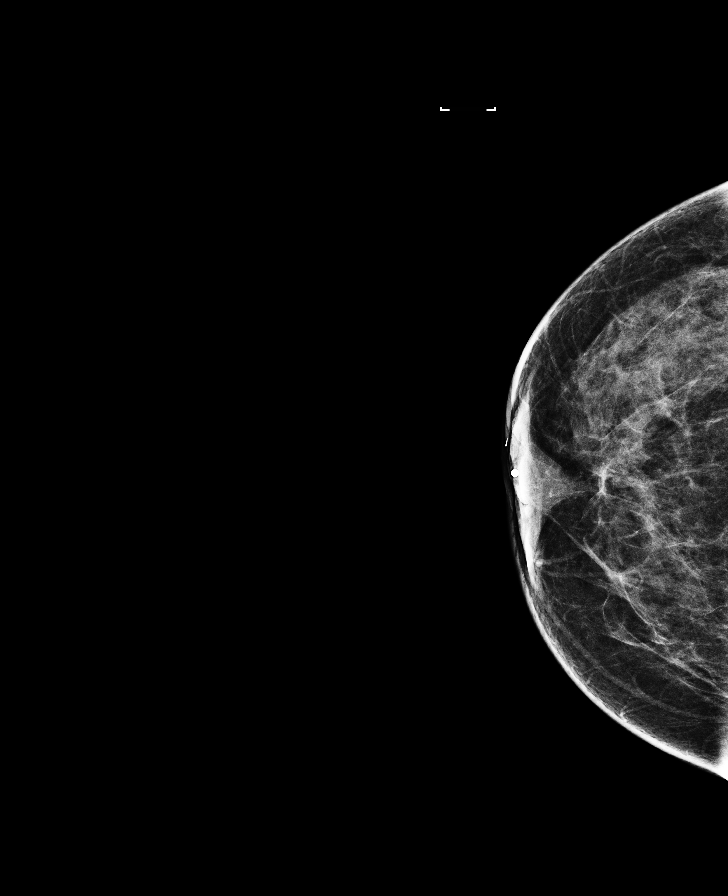

[8 of 35 positions shown; findings below may reference images not displayed]

ACR Breast Density Category c: The breast tissue is heterogeneously
dense, which may obscure small masses.
FINDINGS: Retroareolar architectural distortion and nipple retraction on the
right compatible with postsurgical scarring. Otherwise, no findings
suspicious for malignancy in either breast.

Mammographic images were processed with CAD.

On physical exam, the patient has a small drainage track adjacent to
the inferolateral aspect of the right nipple. There is no palpable
mass, skin redness or increased skin warmth.

Targeted ultrasound is performed, showing a retracted right nipple
with no underlying or adjacent mass or fluid collection.
IMPRESSION: No evidence of malignancy and no abscess seen at this time.

RECOMMENDATION:
Bilateral screening mammogram in 1 year. The patient was instructed
to return sooner if the palpable area in the areolar region of the
right breast recurs and does not resolve with her menstrual period.

I have discussed the findings and recommendations with the patient.
Results were also provided in writing at the conclusion of the
visit. If applicable, a reminder letter will be sent to the patient
regarding the next appointment.

BI-RADS CATEGORY  2: Benign.

## 2019-05-03 ENCOUNTER — Other Ambulatory Visit: Payer: Self-pay | Admitting: Internal Medicine

## 2019-05-03 DIAGNOSIS — Z1231 Encounter for screening mammogram for malignant neoplasm of breast: Secondary | ICD-10-CM

## 2019-05-27 ENCOUNTER — Other Ambulatory Visit: Payer: Self-pay

## 2019-05-27 DIAGNOSIS — Z20822 Contact with and (suspected) exposure to covid-19: Secondary | ICD-10-CM

## 2019-05-28 LAB — NOVEL CORONAVIRUS, NAA: SARS-CoV-2, NAA: NOT DETECTED

## 2020-05-26 ENCOUNTER — Other Ambulatory Visit: Payer: BLUE CROSS/BLUE SHIELD
